# Patient Record
Sex: Female | Born: 1975 | Hispanic: Yes | Marital: Married | Smoking: Former smoker
Health system: Southern US, Community
[De-identification: ages and names within clinical notes are randomized; demographics above are authoritative.]

## PROBLEM LIST (undated history)

## (undated) ENCOUNTER — Inpatient Hospital Stay (HOSPITAL_COMMUNITY): Payer: Self-pay

## (undated) DIAGNOSIS — N6009 Solitary cyst of unspecified breast: Secondary | ICD-10-CM

## (undated) DIAGNOSIS — I00 Rheumatic fever without heart involvement: Secondary | ICD-10-CM

## (undated) DIAGNOSIS — K8050 Calculus of bile duct without cholangitis or cholecystitis without obstruction: Secondary | ICD-10-CM

## (undated) DIAGNOSIS — G43909 Migraine, unspecified, not intractable, without status migrainosus: Secondary | ICD-10-CM

## (undated) HISTORY — PX: WISDOM TOOTH EXTRACTION: SHX21

## (undated) HISTORY — DX: Migraine, unspecified, not intractable, without status migrainosus: G43.909

## (undated) HISTORY — PX: NO PAST SURGERIES: SHX2092

## (undated) HISTORY — DX: Calculus of bile duct without cholangitis or cholecystitis without obstruction: K80.50

---

## 2011-01-22 LAB — HIV ANTIBODY (ROUTINE TESTING W REFLEX): HIV: NONREACTIVE

## 2011-01-22 LAB — ABO/RH: RH Type: POSITIVE

## 2011-01-22 LAB — ANTIBODY SCREEN: Antibody Screen: NEGATIVE

## 2011-07-14 ENCOUNTER — Inpatient Hospital Stay (HOSPITAL_COMMUNITY): Admission: AD | Admit: 2011-07-14 | Payer: Self-pay | Source: Ambulatory Visit | Admitting: Obstetrics and Gynecology

## 2011-07-15 NOTE — L&D Delivery Note (Signed)
Delivery Note At 7:15 AM a viable female, "Samuel Bouche", was delivered via Vaginal, Spontaneous Delivery (Presentation LOA: ;  ).  APGAR: 9, 9; weight 7 lb 8.6 oz (3419 g).   Placenta status: Intact, Spontaneous.  Cord: 3 vessels with the following complications: None.  Cord pH: NA Variable decels during 2nd stage, with good variability throughout.  Patient was complete for 2 hours and 23 minutes, with active pushing beginning at 6:50am.   Had 2 previous very brief episodes of trial pushing prior to that, but then labored down until onset of active pushing as noted, with progress to delivery in 25 minutes.  Anesthesia: Epidural  Episiotomy: None Lacerations: None Suture Repair: None Est. Blood Loss (mL): 150  Mom to postpartum.  Baby to skin to skin with mom. Circumcision planned as inpatient.  [name] 08/22/2011, 7:54 AM

## 2011-07-16 ENCOUNTER — Inpatient Hospital Stay (HOSPITAL_COMMUNITY): Payer: No Typology Code available for payment source

## 2011-07-16 ENCOUNTER — Inpatient Hospital Stay (HOSPITAL_COMMUNITY)
Admission: AD | Admit: 2011-07-16 | Discharge: 2011-07-16 | Disposition: A | Discharge status: Home / Self Care | Payer: No Typology Code available for payment source | Source: Ambulatory Visit | Attending: Obstetrics and Gynecology | Admitting: Obstetrics and Gynecology

## 2011-07-16 ENCOUNTER — Encounter (HOSPITAL_COMMUNITY): Payer: Self-pay

## 2011-07-16 ENCOUNTER — Other Ambulatory Visit: Payer: Self-pay | Admitting: Obstetrics and Gynecology

## 2011-07-16 DIAGNOSIS — O368190 Decreased fetal movements, unspecified trimester, not applicable or unspecified: Secondary | ICD-10-CM | POA: Insufficient documentation

## 2011-07-16 HISTORY — DX: Solitary cyst of unspecified breast: N60.09

## 2011-07-16 HISTORY — DX: Rheumatic fever without heart involvement: I00

## 2011-07-16 NOTE — ED Provider Notes (Signed)
History    36 yo G1P0 at 25 3/7 weeks presented after calling the office with report of decreasing FM since 1/1--had noted some diminishing movement that day, then continued today, with no FM noted early afternoon.  Called the office and was directed here, at which time she did begin to feel some movement.  Denies leaking, bleeding, dysuria, contractions, or any other symptom.  Still aware of less FM than usual.  Pregnancy remarkable for: Migraines Decreased TSH at NOB, normal free T4   Chief Complaint  Patient presents with  . Decreased Fetal Movement     OB History    Grav Para Term Preterm Abortions TAB SAB Ect Mult Living   1               Past Medical History  Diagnosis Date  . Rheumatic fever   . Breast cyst     Past Surgical History  Procedure Date  . No past surgeries     History reviewed. No pertinent family history.  History  Substance Use Topics  . Smoking status: Never Smoker   . Smokeless tobacco: Never Used  . Alcohol Use: Yes     occasional wine    Allergies: No Known Allergies  Prescriptions prior to admission  Medication Sig Dispense Refill  . Prenatal Vit-Fe Fumarate-FA (PRENATAL MULTIVITAMIN) TABS Take 1 tablet by mouth daily.           Physical Exam   Blood pressure 123/66, pulse 117, temperature 98.2 F (36.8 C), temperature source Oral, resp. rate 20, SpO2 97.00%.  Chest clear Heart RRR without murmur Abd gravid, NT Ext WNL  FHR reactive, no decels, one mild variable with single UC UCs very occasional    ED Course  IUP at 35 3/7 weeks Decreased FM  Plan: Will check [place] with BPP for further evaluation of fetal status.  If WNL, will d/c home with [facility] precautions.  [name], CNM, MN 07/16/11 5:25pm  Addendum: Returned from US--[facility] 8/8.  Vtx.  AFI 14.37 cm, 52%ile.  EFW 2572 gm, 5+11 oz, 54%ile.   D/C'd home with [facility] instructions. Follow-up as scheduled at [facility] or prn.  [name], CMN, [place]  07/16/11 6:50p

## 2011-07-16 NOTE — Progress Notes (Signed)
Patient states she has had decreasing fetal movement since 1-1. Has felt some movement on the way to the hospital but not as much as usual. No bleeding or leaking and no pain.

## 2011-08-21 ENCOUNTER — Encounter (HOSPITAL_COMMUNITY): Payer: Self-pay | Admitting: *Deleted

## 2011-08-21 ENCOUNTER — Inpatient Hospital Stay (HOSPITAL_COMMUNITY)
Admission: AD | Admit: 2011-08-21 | Discharge: 2011-08-24 | DRG: 775 | Disposition: A | Discharge status: Home / Self Care | Payer: No Typology Code available for payment source | Source: Ambulatory Visit | Attending: Obstetrics and Gynecology | Admitting: Obstetrics and Gynecology

## 2011-08-21 DIAGNOSIS — O99893 Other specified diseases and conditions complicating puerperium: Secondary | ICD-10-CM | POA: Diagnosis not present

## 2011-08-21 DIAGNOSIS — R011 Cardiac murmur, unspecified: Secondary | ICD-10-CM | POA: Diagnosis not present

## 2011-08-21 DIAGNOSIS — Z3400 Encounter for supervision of normal first pregnancy, unspecified trimester: Secondary | ICD-10-CM

## 2011-08-21 DIAGNOSIS — O4700 False labor before 37 completed weeks of gestation, unspecified trimester: Secondary | ICD-10-CM

## 2011-08-21 DIAGNOSIS — IMO0001 Reserved for inherently not codable concepts without codable children: Secondary | ICD-10-CM

## 2011-08-21 LAB — CBC
HCT: 33.5 % — ABNORMAL LOW (ref 36.0–46.0)
Hemoglobin: 11.3 g/dL — ABNORMAL LOW (ref 12.0–15.0)
MCHC: 33.7 g/dL (ref 30.0–36.0)
MCV: 88.6 fL (ref 78.0–100.0)
WBC: 7.4 10*3/uL (ref 4.0–10.5)

## 2011-08-21 MED ORDER — ONDANSETRON HCL 4 MG/2ML IJ SOLN: 4.0000 mg | Freq: Four times a day (QID) | INTRAMUSCULAR | Status: DC | PRN

## 2011-08-21 MED ORDER — LACTATED RINGERS IV SOLN: 500.0000 mL | INTRAVENOUS | Status: DC | PRN

## 2011-08-21 MED ORDER — IBUPROFEN 600 MG PO TABS: 600.0000 mg | ORAL_TABLET | Freq: Four times a day (QID) | ORAL | Status: DC | PRN

## 2011-08-21 MED ORDER — BUTORPHANOL TARTRATE 2 MG/ML IJ SOLN: 1.0000 mg | INTRAMUSCULAR | Status: DC | PRN

## 2011-08-21 MED ORDER — OXYTOCIN 20 UNITS IN LACTATED RINGERS INFUSION - SIMPLE: 125.0000 mL/h | Freq: Once | INTRAVENOUS | Status: DC

## 2011-08-21 MED ORDER — OXYTOCIN BOLUS FROM INFUSION
500.0000 mL | Freq: Once | INTRAVENOUS | Status: DC
  Filled 2011-08-21: qty 500

## 2011-08-21 MED ORDER — LIDOCAINE HCL (PF) 1 % IJ SOLN: 30.0000 mL | INTRAMUSCULAR | Status: DC | PRN

## 2011-08-21 MED ORDER — ACETAMINOPHEN 325 MG PO TABS: 650.0000 mg | ORAL_TABLET | ORAL | Status: DC | PRN

## 2011-08-21 MED ORDER — FLEET ENEMA 7-19 GM/118ML RE ENEM: 1.0000 | ENEMA | RECTAL | Status: DC | PRN

## 2011-08-21 MED ORDER — LACTATED RINGERS IV SOLN
INTRAVENOUS | Status: DC
  Administered 2011-08-21 – 2011-08-22 (×2): via INTRAVENOUS

## 2011-08-21 MED ORDER — CITRIC ACID-SODIUM CITRATE 334-500 MG/5ML PO SOLN: 30.0000 mL | ORAL | Status: DC | PRN

## 2011-08-21 MED ORDER — OXYCODONE-ACETAMINOPHEN 5-325 MG PO TABS: 1.0000 | ORAL_TABLET | ORAL | Status: DC | PRN

## 2011-08-21 NOTE — Progress Notes (Signed)
Contractions q 3-5 minutes x 2 hours. Denies bleeding or ROM

## 2011-08-21 NOTE — H&P (Signed)
Sara Ross is a 36 y.o. female, G1P0 at 62 4/7 weeks, presenting for contractions q 3-5 min x 2 hours.  Reports contractions began about 3 am today, with gradual increase in frequency and intensity since then.  Had positive FM, denies leaking or bleeding.  Pregnancy remarkable for: Hx migraines Hx decreased TSH at NOB, with normal f/u labs GBS negative  History of present pregnancy: Patient entered care at 7 weeks for interview, 10 weeks for NOB w/u.  She had TSH done at NOB due to increased N/V, with TSH of 0.061, but normal free T 4 in follow-up.  EDC of 08/18/11 was established by LMP and in agreement with 1st trimester screen.  Anatomy scan was done at 18 weeks, with normal findings, but limited anatomy. Anatomy was completed at a follow-up visit.  Her prenatal course was essentially uncomplicated.    At her last evalution, she was 1 cm--this was in office today.  History OB History    Grav Para Term Preterm Abortions TAB SAB Ect Mult Living   1              Past Medical History  Diagnosis Date  . Rheumatic fever   . Breast cyst   Previous Depo user, d/c'd 03/2010.. Migraines on birth control.    Past Surgical History  Procedure Date  . No past surgeries   Biopsy right breast 2010, WNL  Family History: family history is not on file.  Mother HTN.  Sister thyroid disease.  Mother ? Liver/kidney cancer.  Father throat cancer.  Social History:  reports that she has never smoked. She has never used smokeless tobacco. She reports that she drinks alcohol. She reports that she does not use illicit drugs. 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  Patient is college educated and a homemaker.  ROS:  Contractions q 3-5 min x several hours, denies PIH symptoms. Reports + FM.  Dilation: 3 Effacement (%): 100 Station: -1 Exam by:: V.Autym Siess,CNM Blood pressure 126/72, pulse 85, resp. rate 18, height 5\' 2"   (1.575 m), weight 68.493 kg (151 lb), SpO2 98.00%.  Exam Physical Exam   Chest clear Heart RRR without murmur Abd gravid, NT Pelvic--see exam.  Tight band of tissue around cervical os. Ext WNL, no edema  FHR reactive, no decels UC q 3-4 min, moderate    Prenatal labs: ABO, Rh:  AB+ Antibody:  Neg Rubella:  Immune RPR:   NR HBsAg:   Neg HIV:  Neg  GBS:   Neg TSH 0.061 at NOB, normal free t 4 1st trimester screen and AFP WNL Hgb 11.4 at NOB/10.3 at 28 weeks Glucola WNL.   Assessment/Plan: IUP at 40 3/7 weeks Early labor GBS negative  Plan: Admitted to [facility] per consult with Dr. Stefano Gaul Routine CNM orders Plans epidural.  Darlean Warmoth 08/21/2011, 11:11 PM

## 2011-08-22 ENCOUNTER — Inpatient Hospital Stay (HOSPITAL_COMMUNITY): Payer: No Typology Code available for payment source | Admitting: Anesthesiology

## 2011-08-22 ENCOUNTER — Encounter (HOSPITAL_COMMUNITY): Payer: Self-pay | Admitting: Anesthesiology

## 2011-08-22 ENCOUNTER — Encounter (HOSPITAL_COMMUNITY): Payer: Self-pay

## 2011-08-22 DIAGNOSIS — IMO0001 Reserved for inherently not codable concepts without codable children: Secondary | ICD-10-CM

## 2011-08-22 DIAGNOSIS — Z3400 Encounter for supervision of normal first pregnancy, unspecified trimester: Secondary | ICD-10-CM

## 2011-08-22 LAB — GC/CHLAMYDIA PROBE AMP, GENITAL

## 2011-08-22 MED ORDER — MEASLES, MUMPS & RUBELLA VAC ~~LOC~~ INJ
0.5000 mL | INJECTION | Freq: Once | SUBCUTANEOUS | Status: AC
  Administered 2011-08-23: 0.5 mL via SUBCUTANEOUS
  Filled 2011-08-22 (×2): qty 0.5

## 2011-08-22 MED ORDER — PHENYLEPHRINE 40 MCG/ML (10ML) SYRINGE FOR IV PUSH (FOR BLOOD PRESSURE SUPPORT)
80.0000 ug | PREFILLED_SYRINGE | INTRAVENOUS | Status: DC | PRN
  Filled 2011-08-22: qty 5

## 2011-08-22 MED ORDER — OXYTOCIN 20 UNITS IN LACTATED RINGERS INFUSION - SIMPLE
1.0000 m[IU]/min | INTRAVENOUS | Status: DC
  Administered 2011-08-22: 1 m[IU]/min via INTRAVENOUS
  Filled 2011-08-22: qty 1000

## 2011-08-22 MED ORDER — DIPHENHYDRAMINE HCL 25 MG PO CAPS: 25.0000 mg | ORAL_CAPSULE | Freq: Four times a day (QID) | ORAL | Status: DC | PRN

## 2011-08-22 MED ORDER — DIPHENHYDRAMINE HCL 50 MG/ML IJ SOLN: 12.5000 mg | INTRAMUSCULAR | Status: DC | PRN

## 2011-08-22 MED ORDER — BENZOCAINE-MENTHOL 20-0.5 % EX AERO
INHALATION_SPRAY | CUTANEOUS | Status: AC
  Filled 2011-08-22: qty 56

## 2011-08-22 MED ORDER — ZOLPIDEM TARTRATE 10 MG PO TABS: 10.0000 mg | ORAL_TABLET | Freq: Every evening | ORAL | Status: DC | PRN

## 2011-08-22 MED ORDER — FENTANYL 2.5 MCG/ML BUPIVACAINE 1/10 % EPIDURAL INFUSION (WH - ANES)
INTRAMUSCULAR | Status: DC | PRN
  Administered 2011-08-22: 14 mL/h via EPIDURAL

## 2011-08-22 MED ORDER — SENNOSIDES-DOCUSATE SODIUM 8.6-50 MG PO TABS
2.0000 | ORAL_TABLET | Freq: Every day | ORAL | Status: DC
  Administered 2011-08-22: 2 via ORAL

## 2011-08-22 MED ORDER — SIMETHICONE 80 MG PO CHEW: 80.0000 mg | CHEWABLE_TABLET | ORAL | Status: DC | PRN

## 2011-08-22 MED ORDER — OXYCODONE-ACETAMINOPHEN 5-325 MG PO TABS
1.0000 | ORAL_TABLET | ORAL | Status: DC | PRN
  Administered 2011-08-22: 1 via ORAL
  Filled 2011-08-22: qty 1

## 2011-08-22 MED ORDER — TETANUS-DIPHTH-ACELL PERTUSSIS 5-2.5-18.5 LF-MCG/0.5 IM SUSP
0.5000 mL | Freq: Once | INTRAMUSCULAR | Status: AC
  Administered 2011-08-23: 0.5 mL via INTRAMUSCULAR
  Filled 2011-08-22: qty 0.5

## 2011-08-22 MED ORDER — ONDANSETRON HCL 4 MG PO TABS: 4.0000 mg | ORAL_TABLET | ORAL | Status: DC | PRN

## 2011-08-22 MED ORDER — DIBUCAINE 1 % RE OINT
1.0000 "application " | TOPICAL_OINTMENT | RECTAL | Status: DC | PRN
  Filled 2011-08-22: qty 28

## 2011-08-22 MED ORDER — BENZOCAINE-MENTHOL 20-0.5 % EX AERO: 1.0000 "application " | INHALATION_SPRAY | CUTANEOUS | Status: DC | PRN

## 2011-08-22 MED ORDER — MAGNESIUM HYDROXIDE 400 MG/5ML PO SUSP: 30.0000 mL | ORAL | Status: DC | PRN

## 2011-08-22 MED ORDER — TERBUTALINE SULFATE 1 MG/ML IJ SOLN: 0.2500 mg | Freq: Once | INTRAMUSCULAR | Status: DC | PRN

## 2011-08-22 MED ORDER — EPHEDRINE 5 MG/ML INJ: 10.0000 mg | INTRAVENOUS | Status: DC | PRN

## 2011-08-22 MED ORDER — SODIUM BICARBONATE 8.4 % IV SOLN
INTRAVENOUS | Status: DC | PRN
  Administered 2011-08-22: 4 mL via EPIDURAL

## 2011-08-22 MED ORDER — PRENATAL MULTIVITAMIN CH
1.0000 | ORAL_TABLET | Freq: Every day | ORAL | Status: DC
  Administered 2011-08-22 – 2011-08-24 (×3): 1 via ORAL
  Filled 2011-08-22 (×3): qty 1

## 2011-08-22 MED ORDER — ZOLPIDEM TARTRATE 5 MG PO TABS: 5.0000 mg | ORAL_TABLET | Freq: Every evening | ORAL | Status: DC | PRN

## 2011-08-22 MED ORDER — EPHEDRINE 5 MG/ML INJ
10.0000 mg | INTRAVENOUS | Status: DC | PRN
  Filled 2011-08-22: qty 4

## 2011-08-22 MED ORDER — ONDANSETRON HCL 4 MG/2ML IJ SOLN: 4.0000 mg | INTRAMUSCULAR | Status: DC | PRN

## 2011-08-22 MED ORDER — LANOLIN HYDROUS EX OINT: TOPICAL_OINTMENT | CUTANEOUS | Status: DC | PRN

## 2011-08-22 MED ORDER — FENTANYL 2.5 MCG/ML BUPIVACAINE 1/10 % EPIDURAL INFUSION (WH - ANES)
14.0000 mL/h | INTRAMUSCULAR | Status: DC
  Administered 2011-08-22: 14 mL/h via EPIDURAL
  Filled 2011-08-22 (×2): qty 60

## 2011-08-22 MED ORDER — IBUPROFEN 600 MG PO TABS
600.0000 mg | ORAL_TABLET | Freq: Four times a day (QID) | ORAL | Status: DC
  Administered 2011-08-22 – 2011-08-24 (×6): 600 mg via ORAL
  Filled 2011-08-22 (×8): qty 1

## 2011-08-22 MED ORDER — LACTATED RINGERS IV SOLN: 500.0000 mL | Freq: Once | INTRAVENOUS | Status: DC

## 2011-08-22 MED ORDER — WITCH HAZEL-GLYCERIN EX PADS: 1.0000 "application " | MEDICATED_PAD | CUTANEOUS | Status: DC | PRN

## 2011-08-22 MED ORDER — PHENYLEPHRINE 40 MCG/ML (10ML) SYRINGE FOR IV PUSH (FOR BLOOD PRESSURE SUPPORT): 80.0000 ug | PREFILLED_SYRINGE | INTRAVENOUS | Status: DC | PRN

## 2011-08-22 NOTE — Anesthesia Procedure Notes (Signed)

## 2011-08-22 NOTE — Progress Notes (Addendum)
  Subjective: Comfortable with epidural  Objective: BP 92/44  Pulse 105  Temp(Src) 97.9 F (36.6 C) (Oral)  Resp 18  Ht 5\' 2"  (1.575 m)  Wt 68.493 kg (151 lb)  BMI 27.62 kg/m2  SpO2 98%      FHT:  Category 1 UC:   irregular, every 3-4 minutes SVE:   Dilation: 7 Effacement (%): 100 Station: 0 Exam by:: Manfred Arch, CNM AROM--clear fluid IUPC placed  Labs: Lab Results  Component Value Date   WBC 7.4 08/21/2011   HGB 11.3* 08/21/2011   HCT 33.5* 08/21/2011   MCV 88.6 08/21/2011   PLT 226 08/21/2011    Assessment / Plan: Spontaneous labor, progressing normally Will continue to observe--augment prn.   Sara Ross 08/22/2011, 2am

## 2011-08-22 NOTE — Progress Notes (Signed)
  Subjective: Slight increase in pressure.  Objective: BP 109/72  Pulse 118  Temp(Src) 98.1 F (36.7 C) (Oral)  Resp 18  Ht 5\' 2"  (1.575 m)  Wt 68.493 kg (151 lb)  BMI 27.62 kg/m2  SpO2 98%     FHT:  140s, moderate variability, mild variable decels  UC:   q 2-4 minutes, irregular in quality.  SVE:   Dilation: 10 Effacement (%): 100 Station: 0;+1 Exam by:: Manfred Arch, CNM Pitocin on 3 mu/min   Assessment / Plan: Attempted push--good thrust of contraction, patient not yet very coordinated with pushing effort. Will defer pushing at present until sensation of pressure increases. Will increase pitocin to 4 mu/min and observe.    [name] 08/22/2011, 5:28 AM

## 2011-08-22 NOTE — Progress Notes (Signed)
  Subjective: Comfortable with epidural  Objective: BP 92/44  Pulse 105  Temp(Src) 97.9 F (36.6 C) (Oral)  Resp 18  Ht 5\' 2"  (1.575 m)  Wt 68.493 kg (151 lb)  BMI 27.62 kg/m2  SpO2 98%      FHT:  Category 1 UC:   irregular, every 4-5 minutes, irregular in quality SVE:   Dilation: 7 Effacement (%): 100 Station: 0 Exam by:: Manfred Arch, CNM   Assessment / Plan: Inadequate labor s/p AROM Will start pitocin augmentation.    Sara Ross 08/22/2011, 3:39 AM

## 2011-08-22 NOTE — Progress Notes (Signed)
  Subjective: Feeling some pressure with contractions.  Objective: BP 100/62  Pulse 102  Temp(Src) 98.1 F (36.7 C) (Oral)  Resp 16  Ht 5\' 2"  (1.575 m)  Wt 68.493 kg (151 lb)  BMI 27.62 kg/m2  SpO2 98%      FHT:  FHR: 140s bpm, variability: moderate,  accelerations:  Present,  decelerations:  Present mild variable decels with contractions. UC:   irregular, every 2-5 minutes, most q 2-3 min. MVUs 150 Pitocin on 3 mu/min SVE:   Dilation: 10 Effacement (%): 100 Station: 0;+1 Exam by:: Manfred Arch, CNM   Assessment / Plan: Second stage labor, laboring down. Will continue to observe FHR tracing--if variables persist, will do amnioinfusion. Await increased urge to push.  [name] 08/22/2011, 4:57 AM

## 2011-08-22 NOTE — Anesthesia Preprocedure Evaluation (Signed)
Anesthesia Evaluation  Patient identified by MRN, date of birth, ID band Patient awake    Reviewed: Allergy & Precautions, H&P , Patient's Chart, lab work & pertinent test results  Airway Mallampati: II TM Distance: >3 FB Neck ROM: full    Dental  (+) Teeth Intact   Pulmonary  clear to auscultation        Cardiovascular Valvular problems/murmurs: neg w/u for Rheumatic Heart Dz. regular Normal    Neuro/Psych    GI/Hepatic   Endo/Other    Renal/GU      Musculoskeletal   Abdominal   Peds  Hematology   Anesthesia Other Findings       Reproductive/Obstetrics (+) Pregnancy                           Anesthesia Physical Anesthesia Plan  ASA: II  Anesthesia Plan: Epidural   Post-op Pain Management:    Induction:   Airway Management Planned:   Additional Equipment:   Intra-op Plan:   Post-operative Plan:   Informed Consent: I have reviewed the patients History and Physical, chart, labs and discussed the procedure including the risks, benefits and alternatives for the proposed anesthesia with the patient or authorized representative who has indicated his/her understanding and acceptance.   Dental Advisory Given  Plan Discussed with:   Anesthesia Plan Comments: (Labs checked- platelets confirmed with RN in room. Fetal heart tracing, per RN, reported to be stable enough for sitting procedure. Discussed epidural, and patient consents to the procedure:  included risk of possible headache,backache, failed block, allergic reaction, and nerve injury. This patient was asked if she had any questions or concerns before the procedure started. )        Anesthesia Quick Evaluation

## 2011-08-23 LAB — CBC
MCH: 30.5 pg (ref 26.0–34.0)
MCHC: 33.8 g/dL (ref 30.0–36.0)
MCV: 90.2 fL (ref 78.0–100.0)
Platelets: 168 10*3/uL (ref 150–400)
RDW: 14 % (ref 11.5–15.5)

## 2011-08-23 NOTE — Anesthesia Postprocedure Evaluation (Signed)
  Anesthesia Post-op Note  Patient: Pharmacist, community  Procedure(s) Performed: * No procedures listed *  Patient Location: Mother/Baby  Anesthesia Type: Epidural  Level of Consciousness: awake, alert  and oriented  Airway and Oxygen Therapy: Patient Spontanous Breathing  Post-op Pain: mild  Post-op Assessment: Patient's Cardiovascular Status Stable, Respiratory Function Stable, Patent Airway, No signs of Nausea or vomiting and Pain level controlled  Post-op Vital Signs: stable  Complications: No apparent anesthesia complications

## 2011-08-23 NOTE — Anesthesia Postprocedure Evaluation (Signed)
  Anesthesia Post-op Note  Patient: Sara Ross  Procedure(s) Performed: * No procedures listed *  Patient Location: Mother/Baby  Anesthesia Type: Epidural  Level of Consciousness: awake, alert  and oriented  Airway and Oxygen Therapy: Patient Spontanous Breathing  Post-op Pain: mild  Post-op Assessment: Patient's Cardiovascular Status Stable, Respiratory Function Stable, Patent Airway, No signs of Nausea or vomiting and Pain level controlled  Post-op Vital Signs: stable  Complications: No apparent anesthesia complications 

## 2011-08-23 NOTE — Progress Notes (Signed)
Post Partum Day 1 Subjective: Reports feeling well.  Ambulating, voiding and tol po liquids and solids without difficulty.  Denies weakness or dizziness.  Some nipple tenderness with breast feeding, otherwise OK.  Plans circ prior to d/c for baby. States she had rheumatic fever in the past and has undergone echocardiography in the past few years after that diagnosis and no abnormalities identified.  Denies any previous hx of murmur.    Objective: Blood pressure 89/65, pulse 94, temperature 98.3 F (36.8 C), temperature source Oral, resp. rate 18, height 5\' 2"  (1.575 m), weight 68.493 kg (151 lb), SpO2 97.00%, unknown if currently breastfeeding.  Physical Exam:  General: alert, cooperative and no distress Heart:  RRR, Gr III/VI murmur noted rt 4th ICS and at apex. Lungs:  CTA bilat Abd:  Soft, NT with pos BS x 4 quads Lochia: appropriate Uterine Fundus: firm, NT, 1 below umb. Incision: N/A  Perineum intact DVT Evaluation: No evidence of DVT seen on physical exam. Negative Homan's sign bilat. No significant calf/ankle edema.   Basename 08/23/11 0530 08/21/11 2320  HGB 10.0* 11.3*  HCT 29.6* 33.5*    Assessment/Plan: Stable s/p vaginal delivery Hx rheumatic fever Cardiac murmur  Continue current care and observe carefully. Anticipate discharge to home tomorrow.  Dr. Normand Sloop aware of PE findings.    LOS: 2 days   Sara Ross O. 08/23/2011, 10:43 AM

## 2011-08-24 MED ORDER — IBUPROFEN 600 MG PO TABS: 600.0000 mg | ORAL_TABLET | Freq: Four times a day (QID) | ORAL | Status: AC

## 2011-08-24 NOTE — Progress Notes (Signed)
Post Partum Day 2 Subjective: no complaints, up ad lib without syncope, voiding, tolerating PO, + flatus  Pain well controlled with po meds BF well Mood stable, bonding well   Objective: Blood pressure 102/68, pulse 99, temperature 98.7 F (37.1 C), temperature source Oral, resp. rate 18, height 5\' 2"  (1.575 m), weight 68.493 kg (151 lb), SpO2 97.00%, unknown if currently breastfeeding.  Physical Exam:  General: alert and no distress Lungs: CTAB Heart: RRR Breasts: WNL Lochia: appropriate Uterine Fundus: firm Perineum: WNL DVT Evaluation: No evidence of DVT seen on physical exam. Negative Homan's sign. No significant calf/ankle edema.   Basename 08/23/11 0530 08/21/11 2320  HGB 10.0* 11.3*  HCT 29.6* 33.5*    Assessment/Plan: Discharge home and Contraception husband plans vasectomy      LOS: 3 days   Labron Bloodgood M 08/24/2011, 10:33 AM

## 2011-08-24 NOTE — Discharge Summary (Signed)
   Obstetric Discharge Summary Reason for Admission: onset of labor Prenatal Procedures: ultrasound Intrapartum Procedures: spontaneous vaginal delivery and epidural Postpartum Procedures: none Complications-Operative and Postpartum: none  Temp:  [98.6 F (37 C)-98.7 F (37.1 C)] 98.7 F (37.1 C) (02/10 0541) Pulse Rate:  [92-99] 99  (02/10 0541) Resp:  [18] 18  (02/10 0541) BP: (102-111)/(68-75) 102/68 mmHg (02/10 0541) Hemoglobin  Date Value Range Status  08/23/2011 10.0* 12.0-15.0 (g/dL) Final     HCT  Date Value Range Status  08/23/2011 29.6* 36.0-46.0 (%) Final    Hospital Course:  Hospital Course: Admitted in labor and received epidural. neg GBS. AROM, and IUPC placed, pitocin aug, laboring down,  Progressed to fully dilated, . Delivery was performed by V.Emilee Hero, CNM without difficulty. Patient and baby tolerated the procedure without difficulty, with no laceration noted. Infant to FTN. Mother and infant then had an uncomplicated postpartum course, with breast feeding going well. Mom's physical exam was WNL, and she was discharged home in stable condition. Contraception plan was vasectomy for husband.  She received adequate benefit from po pain medications.  Discharge Diagnoses: Term Pregnancy-delivered  Discharge Information: Date: 08/24/2011 Activity: pelvic rest Diet: routine Medications:  Medication List  As of 08/24/2011  3:45 PM   START taking these medications         ibuprofen 600 MG tablet   Commonly known as: ADVIL,MOTRIN   Take 1 tablet (600 mg total) by mouth every 6 (six) hours.         CONTINUE taking these medications         calcium carbonate 500 MG chewable tablet   Commonly known as: TUMS - dosed in mg elemental calcium      prenatal multivitamin Tabs          Where to get your medications    These are the prescriptions that you need to pick up.   You may get these medications from any pharmacy.         ibuprofen 600 MG tablet            Condition: stable Instructions: refer to practice specific booklet Discharge to: home Follow-up Information    Follow up with Aviel Davalos M, CNM in 6 weeks. (If symptoms worsen)    Contact information:   [address] [place] [address] [phone]          Newborn Data: Live born  Information for the patient's newborn:  Kingslee, Dowse [[id number]]  female ; APGAR , 9, 9 ; weight ; 7#9oz Home with mother.  Shaneta Cervenka M 08/24/2011, 3:45 PM

## 2011-10-08 ENCOUNTER — Ambulatory Visit (INDEPENDENT_AMBULATORY_CARE_PROVIDER_SITE_OTHER): Payer: No Typology Code available for payment source | Admitting: Obstetrics and Gynecology

## 2011-12-22 ENCOUNTER — Emergency Department (HOSPITAL_BASED_OUTPATIENT_CLINIC_OR_DEPARTMENT_OTHER)
Admission: EM | Admit: 2011-12-22 | Discharge: 2011-12-22 | Disposition: A | Discharge status: Home / Self Care | Payer: No Typology Code available for payment source | Attending: Emergency Medicine | Admitting: Emergency Medicine

## 2011-12-22 ENCOUNTER — Emergency Department (HOSPITAL_BASED_OUTPATIENT_CLINIC_OR_DEPARTMENT_OTHER): Payer: No Typology Code available for payment source

## 2011-12-22 ENCOUNTER — Encounter (HOSPITAL_BASED_OUTPATIENT_CLINIC_OR_DEPARTMENT_OTHER): Payer: Self-pay

## 2011-12-22 DIAGNOSIS — R1011 Right upper quadrant pain: Secondary | ICD-10-CM | POA: Insufficient documentation

## 2011-12-22 DIAGNOSIS — R1013 Epigastric pain: Secondary | ICD-10-CM | POA: Insufficient documentation

## 2011-12-22 DIAGNOSIS — K8020 Calculus of gallbladder without cholecystitis without obstruction: Secondary | ICD-10-CM | POA: Insufficient documentation

## 2011-12-22 LAB — CBC
HCT: 36.5 % (ref 36.0–46.0)
Hemoglobin: 12.8 g/dL (ref 12.0–15.0)
MCH: 30.7 pg (ref 26.0–34.0)
MCHC: 35.1 g/dL (ref 30.0–36.0)
MCV: 87.5 fL (ref 78.0–100.0)
Platelets: 252 10*3/uL (ref 150–400)
RBC: 4.17 MIL/uL (ref 3.87–5.11)
RDW: 13.6 % (ref 11.5–15.5)
WBC: 8.7 K/uL (ref 4.0–10.5)

## 2011-12-22 LAB — DIFFERENTIAL
Basophils Absolute: 0 K/uL (ref 0.0–0.1)
Basophils Relative: 1 % (ref 0–1)
Eosinophils Absolute: 0.1 10*3/uL (ref 0.0–0.7)
Eosinophils Relative: 2 % (ref 0–5)
Lymphocytes Relative: 26 % (ref 12–46)
Lymphs Abs: 2.3 10*3/uL (ref 0.7–4.0)
Monocytes Absolute: 0.5 K/uL (ref 0.1–1.0)
Monocytes Relative: 5 % (ref 3–12)
Neutro Abs: 5.8 K/uL (ref 1.7–7.7)
Neutrophils Relative %: 66 % (ref 43–77)

## 2011-12-22 LAB — COMPREHENSIVE METABOLIC PANEL WITH GFR
ALT: 14 U/L (ref 0–35)
AST: 21 U/L (ref 0–37)
Albumin: 4.4 g/dL (ref 3.5–5.2)
Alkaline Phosphatase: 77 U/L (ref 39–117)
CO2: 27 meq/L (ref 19–32)
Chloride: 102 meq/L (ref 96–112)
GFR calc non Af Amer: >90 mL/min (ref >90)
Potassium: 3.5 meq/L (ref 3.5–5.1)
Total Bilirubin: 0.5 mg/dL (ref 0.3–1.2)

## 2011-12-22 LAB — URINALYSIS, ROUTINE W REFLEX MICROSCOPIC
Bilirubin Urine: NEGATIVE
Glucose, UA: NEGATIVE mg/dL
Hgb urine dipstick: NEGATIVE
Ketones, ur: NEGATIVE mg/dL
Nitrite: NEGATIVE
Protein, ur: NEGATIVE mg/dL
Specific Gravity, Urine: 1.02 (ref 1.005–1.030)
Urobilinogen, UA: 0.2 mg/dL (ref 0.0–1.0)
pH: 7.5 (ref 5.0–8.0)

## 2011-12-22 LAB — COMPREHENSIVE METABOLIC PANEL
BUN: 15 mg/dL (ref 6–23)
Calcium: 9.7 mg/dL (ref 8.4–10.5)
Creatinine, Ser: 0.6 mg/dL (ref 0.50–1.10)
GFR calc Af Amer: >90 mL/min (ref >90)
Glucose, Bld: 90 mg/dL (ref 70–99)
Sodium: 141 mEq/L (ref 135–145)
Total Protein: 8.1 g/dL (ref 6.0–8.3)

## 2011-12-22 LAB — URINE MICROSCOPIC-ADD ON

## 2011-12-22 LAB — PREGNANCY, URINE: Preg Test, Ur: NEGATIVE

## 2011-12-22 MED ORDER — MORPHINE SULFATE 4 MG/ML IJ SOLN
4.0000 mg | Freq: Once | INTRAMUSCULAR | Status: AC
  Administered 2011-12-22: 4 mg via INTRAVENOUS
  Filled 2011-12-22: qty 1

## 2011-12-22 MED ORDER — ONDANSETRON 8 MG PO TBDP: 8.0000 mg | ORAL_TABLET | Freq: Three times a day (TID) | ORAL | Status: AC | PRN

## 2011-12-22 MED ORDER — ONDANSETRON HCL 4 MG/2ML IJ SOLN
4.0000 mg | Freq: Once | INTRAMUSCULAR | Status: AC
  Administered 2011-12-22: 4 mg via INTRAVENOUS
  Filled 2011-12-22: qty 2

## 2011-12-22 MED ORDER — OXYCODONE-ACETAMINOPHEN 5-325 MG PO TABS: 1.0000 | ORAL_TABLET | Freq: Four times a day (QID) | ORAL | Status: AC | PRN

## 2011-12-22 MED ORDER — HYDROMORPHONE HCL PF 1 MG/ML IJ SOLN
1.0000 mg | Freq: Once | INTRAMUSCULAR | Status: AC
  Administered 2011-12-22: 1 mg via INTRAVENOUS
  Filled 2011-12-22: qty 1

## 2011-12-22 NOTE — ED Notes (Signed)
Pt reports abdominal pain that started today, nausea and vomiting last night.

## 2011-12-22 NOTE — ED Notes (Signed)
Pt educated about not breastfeeding if taking pain medication, educated to take tylenol or ibuprofen unless pain not controlled then pt to take vicodin, pt educated not to breastfeed if  Consistently taking pain med

## 2011-12-22 NOTE — Discharge Instructions (Signed)
Please read and follow all provided instructions.  Your diagnoses today include:  1. Cholelithiasis     Tests performed today include:  Blood counts and electrolytes  Blood tests to check liver and kidney function  Blood tests to check pancreas function  Urine test to look for infection and pregnancy (in women)  Ultrasound which shows gallstones but no gallbladder infection  Vital signs. See below for your results today.   Medications prescribed:   Percocet (oxycodone/acetaminophen) - narcotic pain medication  You have been prescribed narcotic pain medication such as Vicodin or Percocet: DO NOT drive or perform any activities that require you to be awake and alert because this medicine can make you drowsy. BE VERY CAREFUL not to take multiple medicines containing Tylenol (also called acetaminophen). Doing so can lead to an overdose which can damage your liver and cause liver failure and possibly death.    Zofran (ondansetron) - for nausea and vomiting  Take any prescribed medications only as directed.  Home care instructions:   Follow any educational materials contained in this packet.  Follow-up instructions: Please follow-up with the surgery referral in the next 1 week for further evaluation of your symptoms.  If you do not have a primary care doctor -- see below for referral information.   Return instructions:  SEEK IMMEDIATE MEDICAL ATTENTION IF:  The pain does not go away or becomes severe   A temperature above 101F develops   Repeated vomiting occurs (multiple episodes)   The pain becomes localized to portions of the abdomen. The right side could possibly be appendicitis. In an adult, the left lower portion of the abdomen could be colitis or diverticulitis.   Blood is being passed in stools or vomit (bright red or black tarry stools)   You develop chest pain, difficulty breathing, dizziness or fainting, or become confused, poorly responsive, or inconsolable  (young children)  If you have any other emergent concerns regarding your health  Additional Information: Abdominal (belly) pain can be caused by many things. Your caregiver performed an examination and possibly ordered blood/urine tests and imaging (CT scan, x-rays, ultrasound). Many cases can be observed and treated at home after initial evaluation in the emergency department. Even though you are being discharged home, abdominal pain can be unpredictable. Therefore, you need a repeated exam if your pain does not resolve, returns, or worsens. Most patients with abdominal pain don't have to be admitted to the hospital or have surgery, but serious problems like appendicitis and gallbladder attacks can start out as nonspecific pain. Many abdominal conditions cannot be diagnosed in one visit, so follow-up evaluations are very important.  Your vital signs today were: BP 108/67  Pulse 72  Temp 97.6 F (36.4 C)  Resp 16  Ht 5\' 2"  (1.575 m)  Wt 120 lb (54.432 kg)  BMI 21.95 kg/m2  SpO2 100%  Breastfeeding? Yes If your blood pressure (bp) was elevated above 135/85 this visit, please have this repeated by your doctor within one month. -------------- No Primary Care Doctor Call [facility]  [phone] Other agencies that provide inexpensive medical care    [facility]  [phone]    [facility]  [phone]    [facility]  [phone]    [facility]  [phone]    [facility]  [phone]    [facility]  [phone] -------------- RESOURCE GUIDE:  Dental Problems  Patients with Medicaid: [facility]  [facility] [address]                                            [phone] [address] [employer] Phone:  [phone]                                                      Phone:  [phone]  If unable to pay or uninsured, contact:  [facility] or [facility]. to become qualified for the adult  dental clinic.  Chronic Pain Problems Contact [facility]  [phone] Patients need to be referred by their primary care doctor.  Insufficient Money for Medicine Contact [employer]:  call "211" or [facility] [phone].  Psychological Services [facility]  [phone] [facility]  [phone] [facility]   [phone] (emergency services [phone])  Substance Abuse Resources [facility]  [phone] [facility] [phone] [facility] [place] [phone] Floydene Flock [phone] Residential & Outpatient Substance Abuse Program  [phone]  Abuse/Neglect [facility] Child Abuse Hotline 807-017-2211 [facility] Child Abuse Hotline (940)040-1027 (After Hours)  Emergency Shelter [facility] [phone] [phone]  Maternity Homes [facility] [place] [facility] [phone] [facility] ([phone]  [facility] Resources  [facility]     [employer]                          [facility] Dept. [address] [place]                       [address]      [address]  Blondell Reveal Phone:  [phone]                                   Phone:  [phone]                 Phone:  [phone]  [facility] Mental Health Phone:  [phone]  [facility] Child Abuse Hotline 901-627-2869 [phone] (After Hours)

## 2011-12-22 NOTE — ED Provider Notes (Signed)
History     CSN: [id number]  Arrival date & time 12/22/11  7829   First MD Initiated Contact with Patient 12/22/11 1928      Chief Complaint  Patient presents with  . Abdominal Pain    (Consider location/radiation/quality/duration/timing/severity/associated sxs/prior treatment) HPI Comments: Patient presents with complaint of abdominal pain in the epigastrium and right upper quadrant that started this afternoon. Pain is sharp and described as a pulling sensation and does not radiate. She has not had associated nausea or vomiting today however she did have nausea, vomiting in the absence of pain last night. Patient has had 2 episodes of similar symptoms in the past several months. She has no history of gallbladder problems. Food did not seem to make the pain worse. She denies blood in stool or bowel changes. She denies urinary symptoms. Palpation makes the pain worse. Nothing makes it better. No treatments prior to arrival. Onset was acute, course is constant.  Patient is a 36 y.o. female presenting with abdominal pain. The history is provided by the patient.  Abdominal Pain The primary symptoms of the illness include abdominal pain. The primary symptoms of the illness do not include fever, shortness of breath, nausea, vomiting, diarrhea, hematochezia or dysuria. The current episode started 3 to 5 hours ago. The onset of the illness was sudden. The problem has not changed since onset.   Past Medical History  Diagnosis Date  . Rheumatic fever   . Breast cyst     Past Surgical History  Procedure Date  . No past surgeries     No family history on file.  History  Substance Use Topics  . Smoking status: Never Smoker   . Smokeless tobacco: Never Used  . Alcohol Use: 1.2 oz/week    2 Glasses of wine per week     occasional wine    OB History    Grav Para Term Preterm Abortions TAB SAB Ect Mult Living   1 1 1       1       Review of Systems  Constitutional: Negative for  fever.  HENT: Negative for sore throat and rhinorrhea.   Eyes: Negative for redness.  Respiratory: Negative for cough and shortness of breath.   Cardiovascular: Negative for chest pain.  Gastrointestinal: Positive for abdominal pain. Negative for nausea, vomiting, diarrhea, blood in stool and hematochezia.  Genitourinary: Negative for dysuria.  Musculoskeletal: Negative for myalgias.  Skin: Negative for rash.  Neurological: Negative for headaches.    Allergies  Review of patient's allergies indicates no known allergies.  Home Medications  No current outpatient prescriptions on file.  BP 93/54  Pulse 85  Temp 97.6 F (36.4 C)  Resp 16  Ht 5\' 2"  (1.575 m)  Wt 120 lb (54.432 kg)  BMI 21.95 kg/m2  SpO2 100%  Breastfeeding? Yes  Physical Exam  Nursing note and vitals reviewed. Constitutional: She appears well-developed and well-nourished.  HENT:  Head: Normocephalic and atraumatic.  Eyes: Conjunctivae are normal. Right eye exhibits no discharge. Left eye exhibits no discharge.  Neck: Normal range of motion. Neck supple.  Cardiovascular: Normal rate, regular rhythm and normal heart sounds.   Pulmonary/Chest: Effort normal and breath sounds normal.  Abdominal: Soft. There is tenderness in the right upper quadrant and epigastric area. There is no rigidity, no rebound, no guarding, no CVA tenderness, no tenderness at McBurney's point and negative Murphy's sign.    Neurological: She is alert.  Skin: Skin is warm and dry.  Psychiatric:  She has a normal mood and affect.    ED Course  Procedures (including critical care time)  Labs Reviewed  URINALYSIS, ROUTINE W REFLEX MICROSCOPIC - Abnormal; Notable for the following:    APPearance CLOUDY (*)    Leukocytes, UA LARGE (*)    All other components within normal limits  URINE MICROSCOPIC-ADD ON - Abnormal; Notable for the following:    Squamous Epithelial / LPF FEW (*)    Bacteria, UA FEW (*)    All other components within  normal limits  PREGNANCY, URINE  CBC  DIFFERENTIAL  COMPREHENSIVE METABOLIC PANEL   US Abdomen Complete  12/22/2011  *RADIOLOGY REPORT*  Clinical Data:  Right upper quadrant pain, nausea, vomiting.  COMPLETE ABDOMINAL ULTRASOUND  Comparison:  None.  Findings:  Gallbladder:  Gallstones are present.  No gallbladder wall thickening or pericholecystic fluid. Per the sonographer, positive sonographic Murphy's sign.  Common bile duct:  Measures 2 mm proximally, with normal limits. The distal duct is obscured by overlying bowel gas artifact.  Liver:  No focal lesion identified.  Within normal limits in parenchymal echogenicity.  IVC:  Poorly visualized due to overlying bowel gas artifact.  Pancreas:  No focal abnormality seen. Portions of the body and tail are obscured by overlying bowel gas artifact.  Spleen:  Measures 10.3 cm.  Normal echogenicity.  Right Kidney:  Measures 10.7 cm.  No focal lesion or hydronephrosis.  Left Kidney:  Measures 9.3 cm.  No hydronephrosis or focal abnormality.  Abdominal aorta:  No aneurysm identified. Measures up to 1.9 cm in diameter where seen. There are portions that were poorly visualized due to overlying bowel gas artifact.  IMPRESSION: Cholelithiasis without gallbladder wall thickening or pericholecystic fluid.  Per the sonographer, positive sonographic Murphy's sign.  Therefore, acute cholecystitis not excluded. Consider HIDA scan if clinically warranted.  Original Report Authenticated By: Waneta Martins, M.D.     1. Cholelithiasis     8:02 PM Patient seen and examined. Work-up initiated. Medications ordered.   Vital signs reviewed and are as follows: Filed Vitals:   12/22/11 1841  BP: 93/54  Pulse: 85  Temp: 97.6 F (36.4 C)  Resp: 16      Labs reviewed with patient. Informed of [place] results. Her pain is much improved after pain medication x 2. Abdominal re-exam continues with RUQ tenderness but no Murphy's sign. Patient tolerating PO's. Pt agrees to  discharge home with surgery referral, return if worsening.   Patient counseled on use of narcotic pain medications. Counseled not to combine these medications with others containing tylenol. Urged not to drink alcohol, drive, or perform any other activities that requires focus while taking these medications. The patient verbalizes understanding and agrees with the plan.  The patient was urged to return to the Emergency Department immediately with worsening of current symptoms, worsening abdominal pain, persistent vomiting, blood noted in stools, fever, or any other concerns. The patient verbalized understanding.      MDM  RUQ pain, likely 2/2 cholelithiasis. Pain controlled in ED. Positive sonographic Murphy sign, however no GB wall thickening, pericholecystic fluid, fever, elevated WBC count. Patient appears well, is tolerating PO's, pain is reasonably controlled and she should be able to follow-up with surgery as outpatient. Strict return instructions given.         Renne Crigler, [place] 12/22/11 2249

## 2011-12-22 NOTE — ED Provider Notes (Signed)
Medical screening examination/treatment/procedure(s) were performed by non-physician practitioner and as supervising physician I was immediately available for consultation/collaboration.   Aisa Schoeppner Y. Obdulio Mash, MD 12/22/11 2347 

## 2012-01-23 ENCOUNTER — Ambulatory Visit (INDEPENDENT_AMBULATORY_CARE_PROVIDER_SITE_OTHER): Payer: No Typology Code available for payment source | Admitting: Surgery

## 2012-01-23 ENCOUNTER — Encounter (INDEPENDENT_AMBULATORY_CARE_PROVIDER_SITE_OTHER): Payer: Self-pay | Admitting: Surgery

## 2012-01-23 VITALS — BP 116/78 | HR 76 | Temp 97.4°F | Resp 16 | Ht 62.0 in | Wt 120.0 lb

## 2012-01-23 DIAGNOSIS — K8050 Calculus of bile duct without cholangitis or cholecystitis without obstruction: Secondary | ICD-10-CM | POA: Insufficient documentation

## 2012-01-23 DIAGNOSIS — K8020 Calculus of gallbladder without cholecystitis without obstruction: Secondary | ICD-10-CM

## 2012-01-23 NOTE — Progress Notes (Signed)
Patient ID: Sara Ross, female   DOB: 21-Jul-1975, 36 y.o.   MRN: [id number]  No chief complaint on file.   HPI Sara Ross is a 36 y.o. female.   HPIPatient seen at the request of Dr. Maryellen Pile do to right upper quadrant pain. One month ago she was seen in the emergency room due to right upper quadrant pain and epigastric pain. The pain was severe and sharp in nature. No evidence of radiation. Ultrasound was done which showed gallstones. She has had intermittent attacks of pain like this for the last 6 months. No nausea or vomiting. She is eating what she wants now without pain and has had no further problems the last 4 weeks.  Past Medical History  Diagnosis Date  . Rheumatic fever   . Breast cyst     Past Surgical History  Procedure Date  . No past surgeries     Family History  Problem Relation Age of Onset  . Throat cancer Father     Social History History  Substance Use Topics  . Smoking status: Never Smoker   . Smokeless tobacco: Never Used  . Alcohol Use: 1.2 oz/week    2 Glasses of wine per week     occasional wine    No Known Allergies  No current outpatient prescriptions on file.    Review of Systems Review of Systems  Constitutional: Negative for fever, chills and unexpected weight change.  HENT: Negative for hearing loss, congestion, sore throat, trouble swallowing and voice change.   Eyes: Negative for visual disturbance.  Respiratory: Negative for cough and wheezing.   Cardiovascular: Negative for chest pain, palpitations and leg swelling.  Gastrointestinal: Negative for nausea, vomiting, abdominal pain, diarrhea, constipation, blood in stool, abdominal distention and anal bleeding.  Genitourinary: Negative for hematuria, vaginal bleeding and difficulty urinating.  Musculoskeletal: Negative for arthralgias.  Skin: Negative for rash and wound.  Neurological: Negative for seizures, syncope and headaches.  Hematological: Negative for adenopathy. Does  not bruise/bleed easily.  Psychiatric/Behavioral: Negative for confusion.    Blood pressure 116/78, pulse 76, temperature 97.4 F (36.3 C), resp. rate 16, height 5\' 2"  (1.575 m), weight 120 lb (54.432 kg).  Physical Exam Physical Exam  Constitutional: She is oriented to person, place, and time. She appears well-developed and well-nourished.  HENT:  Head: Normocephalic and atraumatic.  Eyes: EOM are normal. Pupils are equal, round, and reactive to light.  Neck: Normal range of motion. Neck supple.  Cardiovascular: Normal rate and regular rhythm.   Pulmonary/Chest: Effort normal and breath sounds normal.  Abdominal: Soft. Bowel sounds are normal. She exhibits no distension. There is no tenderness. There is no rebound and no guarding.  Musculoskeletal: Normal range of motion.  Neurological: She is alert and oriented to person, place, and time.  Skin: Skin is warm and dry.  Psychiatric: She has a normal mood and affect. Her behavior is normal. Judgment and thought content normal.    Data Reviewed  Clinical Data: Right upper quadrant pain, nausea, vomiting.  COMPLETE ABDOMINAL ULTRASOUND  Comparison: None.  Findings:  Gallbladder: Gallstones are present. No gallbladder wall  thickening or pericholecystic fluid. Per the sonographer, positive  sonographic Murphy's sign.  Common bile duct: Measures 2 mm proximally, with normal limits.  The distal duct is obscured by overlying bowel gas artifact.  Liver: No focal lesion identified. Within normal limits in  parenchymal echogenicity.  IVC: Poorly visualized due to overlying bowel gas artifact.  Pancreas: No focal abnormality seen. Portions of  the body and tail  are obscured by overlying bowel gas artifact.  Spleen: Measures 10.3 cm. Normal echogenicity.  Right Kidney: Measures 10.7 cm. No focal lesion or  hydronephrosis.  Left Kidney: Measures 9.3 cm. No hydronephrosis or focal  abnormality.  Abdominal aorta: No aneurysm identified.  Measures up to 1.9 cm in  diameter where seen. There are portions that were poorly visualized  due to overlying bowel gas artifact.  IMPRESSION:  Cholelithiasis without gallbladder wall thickening or  pericholecystic fluid. Per the sonographer, positive sonographic  Murphy's sign. Therefore, acute cholecystitis not excluded.  Consider HIDA scan if clinically warranted.   Assessment    Biliary colic    Plan    Discussed a history of gallstones and symptoms. She has had no further attacks for a month and is eating whatever she wants without pain. I gave her dietary information a low-fat diet as well as dairy products to avoid to decrease her risk of an attack. I recommend a laparoscopic cholecystectomy to her for treatment and discussed medical management as well with her today. She is breast-feeding an infant and wanted to wait  on having surgery until this process is over. I told herif  she is to worsen to contact us and we can move up surgery.       Enda Santo A. 01/23/2012, 11:21 AM

## 2012-01-23 NOTE — Patient Instructions (Signed)

## 2013-09-26 ENCOUNTER — Other Ambulatory Visit: Payer: Self-pay | Admitting: Family Medicine

## 2013-09-26 DIAGNOSIS — R109 Unspecified abdominal pain: Secondary | ICD-10-CM

## 2013-10-03 ENCOUNTER — Ambulatory Visit
Admission: RE | Admit: 2013-10-03 | Discharge: 2013-10-03 | Disposition: A | Discharge status: Home / Self Care | Payer: 59 | Source: Ambulatory Visit | Attending: Family Medicine | Admitting: Family Medicine

## 2013-10-03 DIAGNOSIS — R109 Unspecified abdominal pain: Secondary | ICD-10-CM

## 2013-10-24 ENCOUNTER — Encounter (INDEPENDENT_AMBULATORY_CARE_PROVIDER_SITE_OTHER): Payer: No Typology Code available for payment source | Admitting: Surgery

## 2013-11-07 ENCOUNTER — Encounter (INDEPENDENT_AMBULATORY_CARE_PROVIDER_SITE_OTHER): Payer: Self-pay | Admitting: Surgery

## 2013-11-07 ENCOUNTER — Ambulatory Visit (INDEPENDENT_AMBULATORY_CARE_PROVIDER_SITE_OTHER): Payer: Managed Care, Other (non HMO) | Admitting: Surgery

## 2013-11-07 VITALS — BP 100/70 | HR 70 | Resp 14 | Ht 62.0 in | Wt 126.0 lb

## 2013-11-07 DIAGNOSIS — K8020 Calculus of gallbladder without cholecystitis without obstruction: Secondary | ICD-10-CM

## 2013-11-07 DIAGNOSIS — K8050 Calculus of bile duct without cholangitis or cholecystitis without obstruction: Secondary | ICD-10-CM

## 2013-11-07 NOTE — Progress Notes (Signed)
Patient ID: Lacretia NicksLivia Spitzley, female   DOB: 03/16/1976, 38 y.o.   MRN: [id number]  No chief complaint on file.   HPI Lacretia NicksLivia Mowers is a 38 y.o. female.   HPIPatient seen at the request of Dr. Maryellen Pile[place] do to right upper quadrant pain. One month ago she was seen in the emergency room due to right upper quadrant pain and epigastric pain. The pain was severe and sharp in nature. No evidence of radiation. Ultrasound was done which showed gallstones. She was seen in 2013 and decided against surgery.  She has had a few more bouts of epigastric pain after eating but nothing severe until this past weekend. Had an episode of pain after a fatty meal and wine. She feels better today.  Past Medical History  Diagnosis Date  . Rheumatic fever   . Breast cyst     Past Surgical History  Procedure Laterality Date  . No past surgeries      Family History  Problem Relation Age of Onset  . Throat cancer Father     Social History History  Substance Use Topics  . Smoking status: Never Smoker   . Smokeless tobacco: Never Used  . Alcohol Use: 1.2 oz/week    2 Glasses of wine per week     Comment: occasional wine    No Known Allergies  No current outpatient prescriptions on file.   No current facility-administered medications for this visit.    Review of Systems Review of Systems  Constitutional: Negative for fever, chills and unexpected weight change.  HENT: Negative for hearing loss, congestion, sore throat, trouble swallowing and voice change.   Eyes: Negative for visual disturbance.  Respiratory: Negative for cough and wheezing.   Cardiovascular: Negative for chest pain, palpitations and leg swelling.  Gastrointestinal: Negative for nausea, vomiting, abdominal pain, diarrhea, constipation, blood in stool, abdominal distention and anal bleeding.  Genitourinary: Negative for hematuria, vaginal bleeding and difficulty urinating.  Musculoskeletal: Negative for arthralgias.  Skin: Negative for  rash and wound.  Neurological: Negative for seizures, syncope and headaches.  Hematological: Negative for adenopathy. Does not bruise/bleed easily.  Psychiatric/Behavioral: Negative for confusion.    Blood pressure 100/70, pulse 70, resp. rate 14, height 5\' 2"  (1.575 m), weight 126 lb (57.153 kg).  Physical Exam Physical Exam  Constitutional: She is oriented to person, place, and time. She appears well-developed and well-nourished.  HENT:  Head: Normocephalic and atraumatic.  Eyes: EOM are normal. Pupils are equal, round, and reactive to light.  Neck: Normal range of motion. Neck supple.  Cardiovascular: Normal rate and regular rhythm.   Pulmonary/Chest: Effort normal and breath sounds normal.  Abdominal: Soft. Bowel sounds are normal. She exhibits no distension. There is no tenderness. There is no rebound and no guarding.  Musculoskeletal: Normal range of motion.  Neurological: She is alert and oriented to person, place, and time.  Skin: Skin is warm and dry.  Psychiatric: She has a normal mood and affect. Her behavior is normal. Judgment and thought content normal.    Data Reviewed  Clinical Data: Right upper quadrant pain, nausea, vomiting.  COMPLETE ABDOMINAL ULTRASOUND  Comparison: None.  Findings:  Gallbladder: Gallstones are present. No gallbladder wall  thickening or pericholecystic fluid. Per the sonographer, positive  sonographic Murphy's sign.  Common bile duct: Measures 2 mm proximally, with normal limits.  The distal duct is obscured by overlying bowel gas artifact.  Liver: No focal lesion identified. Within normal limits in  parenchymal echogenicity.  IVC: Poorly visualized  due to overlying bowel gas artifact.  Pancreas: No focal abnormality seen. Portions of the body and tail  are obscured by overlying bowel gas artifact.  Spleen: Measures 10.3 cm. Normal echogenicity.  Right Kidney: Measures 10.7 cm. No focal lesion or  hydronephrosis.  Left Kidney: Measures  9.3 cm. No hydronephrosis or focal  abnormality.  Abdominal aorta: No aneurysm identified. Measures up to 1.9 cm in  diameter where seen. There are portions that were poorly visualized  due to overlying bowel gas artifact.  IMPRESSION:  Cholelithiasis without gallbladder wall thickening or  pericholecystic fluid. Per the sonographer, positive sonographic  Murphy's sign. Therefore, acute cholecystitis not excluded.  Consider HIDA scan if clinically warranted.   Assessment    Biliary colic    Plan    Discussed a history of gallstones and symptoms. She has had no further attacks for a month and is eating whatever she wants without pain. I gave her dietary information a low-fat diet as well as dairy products to avoid to decrease her risk of an attack. I recommend a laparoscopic cholecystectomy to her for treatment and discussed medical management as well with her today.    She is undecided about surgery and will think it over.  Discussed medical management as well.The procedure has been discussed with the patient. Operative and non operative treatments have been discussed. Risks of surgery include bleeding, infection,  Common bile duct injury,  Injury to the stomach,liver, colon,small intestine, abdominal wall,  Diaphragm,  Major blood vessels,  And the need for an open procedure.  Other risks include worsening of medical problems, death,  DVT and pulmonary embolism, and cardiovascular events.   Medical options have also been discussed. The patient has been informed of long term expectations of surgery and non surgical options,  The patient WILL CALL TO SCHEDULE WHEN READY.      Alysia Scism A. Galileo Colello 11/07/2013, 2:25 PM

## 2013-11-07 NOTE — Patient Instructions (Signed)
Laparoscopic Cholecystectomy °Laparoscopic cholecystectomy is surgery to remove the gallbladder. The gallbladder is located in the upper right part of the abdomen, behind the liver. It is a storage sac for bile produced in the liver. Bile aids in the digestion and absorption of fats. Cholecystectomy is often done for inflammation of the gallbladder (cholecystitis). This condition is usually caused by a buildup of gallstones (cholelithiasis) in your gallbladder. Gallstones can block the flow of bile, resulting in inflammation and pain. In severe cases, emergency surgery may be required. When emergency surgery is not required, you will have time to prepare for the procedure. °Laparoscopic surgery is an alternative to open surgery. Laparoscopic surgery has a shorter recovery time. Your common bile duct may also need to be examined during the procedure. If stones are found in the common bile duct, they may be removed. °LET YOUR HEALTH CARE PROVIDER KNOW ABOUT: °· Any allergies you have. °· All medicines you are taking, including vitamins, herbs, eye drops, creams, and over-the-counter medicines. °· Previous problems you or members of your family have had with the use of anesthetics. °· Any blood disorders you have. °· Previous surgeries you have had. °· Medical conditions you have. °RISKS AND COMPLICATIONS °Generally, this is a safe procedure. However, as with any procedure, complications can occur. Possible complications include: °· Infection. °· Damage to the common bile duct, nerves, arteries, veins, or other internal organs such as the stomach, liver, or intestines. °· Bleeding. °· A stone may remain in the common bile duct. °· A bile leak from the cyst duct that is clipped when your gallbladder is removed. °· The need to convert to open surgery, which requires a larger incision in the abdomen. This may be necessary if your surgeon thinks it is not safe to continue with a laparoscopic procedure. °BEFORE THE  PROCEDURE °· Ask your health care provider about changing or stopping any regular medicines. You will need to stop taking aspirin or blood thinners at least 5 days prior to surgery. °· Do not eat or drink anything after midnight the night before surgery. °· Let your health care provider know if you develop a cold or other infectious problem before surgery. °PROCEDURE  °· You will be given medicine to make you sleep through the procedure (general anesthetic). A breathing tube will be placed in your mouth. °· When you are asleep, your surgeon will make several small cuts (incisions) in your abdomen. °· A thin, lighted tube with a tiny camera on the end (laparoscope) is inserted through one of the small incisions. The camera on the laparoscope sends a picture to a TV screen in the operating room. This gives the surgeon a good view inside your abdomen. °· A gas will be pumped into your abdomen. This expands your abdomen so that the surgeon has more room to perform the surgery. °· Other tools needed for the procedure are inserted through the other incisions. The gallbladder is removed through one of the incisions. °· After the removal of your gallbladder, the incisions will be closed with stitches, staples, or skin glue. °AFTER THE PROCEDURE °· You will be taken to a recovery area where your progress will be checked often. °· You may be allowed to go home the same day if your pain is controlled and you can tolerate liquids. °Document Released: 06/30/2005 Document Revised: 04/20/2013 Document Reviewed: 02/09/2013 °[employer]® Patient Information ©2014 [employer]. ° °

## 2014-01-26 ENCOUNTER — Other Ambulatory Visit (INDEPENDENT_AMBULATORY_CARE_PROVIDER_SITE_OTHER): Payer: Self-pay | Admitting: Surgery

## 2014-05-10 ENCOUNTER — Encounter (HOSPITAL_COMMUNITY): Payer: Self-pay | Admitting: Pharmacy Technician

## 2014-05-12 ENCOUNTER — Other Ambulatory Visit (INDEPENDENT_AMBULATORY_CARE_PROVIDER_SITE_OTHER): Payer: Self-pay | Admitting: Surgery

## 2014-05-12 ENCOUNTER — Other Ambulatory Visit (HOSPITAL_COMMUNITY): Payer: Self-pay | Admitting: *Deleted

## 2014-05-12 NOTE — Pre-Procedure Instructions (Signed)
Sara NicksLivia Ross  05/12/2014   Your procedure is scheduled on:  Tuesday, May 23, 2014 at 9:45 AM.   Report to [facility] Entrance "A" Admitting Office at 7:45 AM.   Call this number if you have problems the morning of surgery: [phone]   Remember:   Do not eat food or drink liquids after midnight Monday, 05/22/14.   Take these medicines the morning of surgery with A SIP OF WATER: Tylenol if needed  Stop NSAIDS (Ibuprofen and Aleve) as of today.   Do not wear jewelry, make-up or nail polish.  Do not wear lotions, powders, or perfumes. You may wear deodorant.  Do not shave 48 hours prior to surgery.   Do not bring valuables to the hospital.  [facility] is not responsible                  for any belongings or valuables.               Contacts, dentures or bridgework may not be worn into surgery.  Leave suitcase in the car. After surgery it may be brought to your room.  For patients admitted to the hospital, discharge time is determined by your                treatment team.               Patients discharged the day of surgery will not be allowed to drive home.                 Special Instructions: [facility] - Preparing for Surgery  Before surgery, you can play an important role.  Because skin is not sterile, your skin needs to be as free of germs as possible.  You can reduce the number of germs on you skin by washing with CHG (chlorahexidine gluconate) soap before surgery.  CHG is an antiseptic cleaner which kills germs and bonds with the skin to continue killing germs even after washing.  Please DO NOT use if you have an allergy to CHG or antibacterial soaps.  If your skin becomes reddened/irritated stop using the CHG and inform your nurse when you arrive at Short Stay.  Do not shave (including legs and underarms) for at least 48 hours prior to the first CHG shower.  You may shave your face.  Please follow these instructions carefully:   1.  Shower with CHG Soap  the night before surgery and the                                morning of Surgery.  2.  If you choose to wash your hair, wash your hair first as usual with your       normal shampoo.  3.  After you shampoo, rinse your hair and body thoroughly to remove the                      Shampoo.  4.  Use CHG as you would any other liquid soap.  You can apply chg directly       to the skin and wash gently with scrungie or a clean washcloth.  5.  Apply the CHG Soap to your body ONLY FROM THE NECK DOWN.        Do not use on open wounds or open sores.  Avoid contact with your eyes, ears, mouth and genitals (private  parts).  Wash genitals (private parts) with your normal soap.  6.  Wash thoroughly, paying special attention to the area where your surgery        will be performed.  7.  Thoroughly rinse your body with warm water from the neck down.  8.  DO NOT shower/wash with your normal soap after using and rinsing off       the CHG Soap.  9.  Pat yourself dry with a clean towel.            10.  Wear clean pajamas.            11.  Place clean sheets on your bed the night of your first shower and do not        sleep with pets.  Day of Surgery  Do not apply any lotions the morning of surgery.  Please wear clean clothes to the hospital.     Please read over the following fact sheets that you were given: Pain Booklet, Coughing and Deep Breathing and Surgical Site Infection Prevention

## 2014-05-15 ENCOUNTER — Encounter (HOSPITAL_COMMUNITY): Payer: Self-pay

## 2014-05-15 ENCOUNTER — Encounter (HOSPITAL_COMMUNITY)
Admission: RE | Admit: 2014-05-15 | Discharge: 2014-05-15 | Disposition: A | Discharge status: Home / Self Care | Payer: PRIVATE HEALTH INSURANCE | Source: Ambulatory Visit | Attending: Surgery | Admitting: Surgery

## 2014-05-15 DIAGNOSIS — K8080 Other cholelithiasis without obstruction: Secondary | ICD-10-CM | POA: Diagnosis not present

## 2014-05-15 DIAGNOSIS — Z01812 Encounter for preprocedural laboratory examination: Secondary | ICD-10-CM | POA: Diagnosis present

## 2014-05-15 LAB — CBC
HCT: 39.5 % (ref 36.0–46.0)
HEMOGLOBIN: 13.5 g/dL (ref 12.0–15.0)
MCH: 30.7 pg (ref 26.0–34.0)
MCHC: 34.2 g/dL (ref 30.0–36.0)
MCV: 89.8 fL (ref 78.0–100.0)
PLATELETS: 249 10*3/uL (ref 150–400)
RBC: 4.4 MIL/uL (ref 3.87–5.11)
RDW: 12.9 % (ref 11.5–15.5)
WBC: 6 10*3/uL (ref 4.0–10.5)

## 2014-05-15 LAB — HCG, SERUM, QUALITATIVE: Preg, Serum: NEGATIVE

## 2014-05-22 MED ORDER — CEFAZOLIN SODIUM-DEXTROSE 2-3 GM-% IV SOLR
2.0000 g | INTRAVENOUS | Status: AC
  Administered 2014-05-23: 2 g via INTRAVENOUS
  Filled 2014-05-22: qty 50

## 2014-05-23 ENCOUNTER — Ambulatory Visit (HOSPITAL_COMMUNITY): Payer: PRIVATE HEALTH INSURANCE

## 2014-05-23 ENCOUNTER — Ambulatory Visit (HOSPITAL_COMMUNITY)
Admission: RE | Admit: 2014-05-23 | Discharge: 2014-05-23 | Disposition: A | Discharge status: Home / Self Care | Payer: PRIVATE HEALTH INSURANCE | Source: Ambulatory Visit | Attending: Surgery | Admitting: Surgery

## 2014-05-23 ENCOUNTER — Ambulatory Visit (HOSPITAL_COMMUNITY): Payer: PRIVATE HEALTH INSURANCE | Admitting: Anesthesiology

## 2014-05-23 ENCOUNTER — Encounter (HOSPITAL_COMMUNITY): Payer: Self-pay | Admitting: *Deleted

## 2014-05-23 ENCOUNTER — Encounter (HOSPITAL_COMMUNITY)
Admission: RE | Disposition: A | Discharge status: Home / Self Care | Payer: Self-pay | Source: Ambulatory Visit | Attending: Surgery

## 2014-05-23 DIAGNOSIS — K8020 Calculus of gallbladder without cholecystitis without obstruction: Secondary | ICD-10-CM

## 2014-05-23 DIAGNOSIS — K811 Chronic cholecystitis: Secondary | ICD-10-CM | POA: Diagnosis present

## 2014-05-23 DIAGNOSIS — K8010 Calculus of gallbladder with chronic cholecystitis without obstruction: Secondary | ICD-10-CM | POA: Diagnosis not present

## 2014-05-23 HISTORY — PX: CHOLECYSTECTOMY: SHX55

## 2014-05-23 SURGERY — LAPAROSCOPIC CHOLECYSTECTOMY WITH INTRAOPERATIVE CHOLANGIOGRAM
Anesthesia: General | Site: Abdomen

## 2014-05-23 MED ORDER — ONDANSETRON HCL 4 MG/2ML IJ SOLN
INTRAMUSCULAR | Status: AC
  Filled 2014-05-23: qty 2

## 2014-05-23 MED ORDER — LACTATED RINGERS IV SOLN
INTRAVENOUS | Status: DC
  Administered 2014-05-23 (×2): via INTRAVENOUS

## 2014-05-23 MED ORDER — ONDANSETRON HCL 4 MG/2ML IJ SOLN
INTRAMUSCULAR | Status: DC | PRN
Start: 2014-05-23 — End: 2014-05-23
  Administered 2014-05-23: 4 mg via INTRAVENOUS

## 2014-05-23 MED ORDER — LIDOCAINE HCL (CARDIAC) 20 MG/ML IV SOLN
INTRAVENOUS | Status: DC | PRN
  Administered 2014-05-23: 30 mg via INTRAVENOUS

## 2014-05-23 MED ORDER — SCOPOLAMINE 1 MG/3DAYS TD PT72
MEDICATED_PATCH | TRANSDERMAL | Status: AC
  Filled 2014-05-23: qty 1

## 2014-05-23 MED ORDER — ROCURONIUM BROMIDE 50 MG/5ML IV SOLN
INTRAVENOUS | Status: AC
  Filled 2014-05-23: qty 1

## 2014-05-23 MED ORDER — OXYCODONE HCL 5 MG PO TABS
ORAL_TABLET | ORAL | Status: AC
  Filled 2014-05-23: qty 1

## 2014-05-23 MED ORDER — KETOROLAC TROMETHAMINE 30 MG/ML IJ SOLN
INTRAMUSCULAR | Status: AC
  Filled 2014-05-23: qty 1

## 2014-05-23 MED ORDER — HYDROMORPHONE HCL 1 MG/ML IJ SOLN
0.2500 mg | INTRAMUSCULAR | Status: DC | PRN
  Administered 2014-05-23 (×4): 0.5 mg via INTRAVENOUS

## 2014-05-23 MED ORDER — SODIUM CHLORIDE 0.9 % IJ SOLN: 3.0000 mL | Freq: Two times a day (BID) | INTRAMUSCULAR | Status: DC

## 2014-05-23 MED ORDER — SODIUM CHLORIDE 0.9 % IV SOLN
INTRAVENOUS | Status: DC | PRN
  Administered 2014-05-23: 50 mL

## 2014-05-23 MED ORDER — NEOSTIGMINE METHYLSULFATE 10 MG/10ML IV SOLN
INTRAVENOUS | Status: AC
  Filled 2014-05-23: qty 1

## 2014-05-23 MED ORDER — SODIUM CHLORIDE 0.9 % IR SOLN
Status: DC | PRN
  Administered 2014-05-23: 1000 mL

## 2014-05-23 MED ORDER — HYDROMORPHONE HCL 1 MG/ML IJ SOLN
INTRAMUSCULAR | Status: AC
  Filled 2014-05-23: qty 1

## 2014-05-23 MED ORDER — SCOPOLAMINE 1 MG/3DAYS TD PT72
1.0000 | MEDICATED_PATCH | Freq: Once | TRANSDERMAL | Status: DC
  Administered 2014-05-23: 1.5 mg via TRANSDERMAL

## 2014-05-23 MED ORDER — FENTANYL CITRATE 0.05 MG/ML IJ SOLN
INTRAMUSCULAR | Status: DC | PRN
  Administered 2014-05-23: 50 ug via INTRAVENOUS
  Administered 2014-05-23: 100 ug via INTRAVENOUS
  Administered 2014-05-23 (×2): 50 ug via INTRAVENOUS

## 2014-05-23 MED ORDER — BUPIVACAINE-EPINEPHRINE 0.25% -1:200000 IJ SOLN
INTRAMUSCULAR | Status: DC | PRN
  Administered 2014-05-23: 30 mL

## 2014-05-23 MED ORDER — ACETAMINOPHEN 325 MG PO TABS: 650.0000 mg | ORAL_TABLET | ORAL | Status: DC | PRN

## 2014-05-23 MED ORDER — MIDAZOLAM HCL 2 MG/2ML IJ SOLN
INTRAMUSCULAR | Status: AC
  Filled 2014-05-23: qty 2

## 2014-05-23 MED ORDER — MIDAZOLAM HCL 5 MG/5ML IJ SOLN
INTRAMUSCULAR | Status: DC | PRN
  Administered 2014-05-23: 2 mg via INTRAVENOUS

## 2014-05-23 MED ORDER — GLYCOPYRROLATE 0.2 MG/ML IJ SOLN
INTRAMUSCULAR | Status: AC
  Filled 2014-05-23: qty 2

## 2014-05-23 MED ORDER — LIDOCAINE HCL (CARDIAC) 20 MG/ML IV SOLN
INTRAVENOUS | Status: AC
  Filled 2014-05-23: qty 5

## 2014-05-23 MED ORDER — SODIUM CHLORIDE 0.9 % IJ SOLN: 3.0000 mL | INTRAMUSCULAR | Status: DC | PRN

## 2014-05-23 MED ORDER — PROPOFOL 10 MG/ML IV BOLUS
INTRAVENOUS | Status: DC | PRN
  Administered 2014-05-23: 40 mg via INTRAVENOUS
  Administered 2014-05-23: 110 mg via INTRAVENOUS

## 2014-05-23 MED ORDER — DEXAMETHASONE SODIUM PHOSPHATE 4 MG/ML IJ SOLN
INTRAMUSCULAR | Status: AC
  Filled 2014-05-23: qty 1

## 2014-05-23 MED ORDER — CHLORHEXIDINE GLUCONATE 4 % EX LIQD
1.0000 "application " | Freq: Once | CUTANEOUS | Status: DC
  Filled 2014-05-23: qty 15

## 2014-05-23 MED ORDER — KETOROLAC TROMETHAMINE 30 MG/ML IJ SOLN
30.0000 mg | Freq: Four times a day (QID) | INTRAMUSCULAR | Status: DC
  Administered 2014-05-23: 30 mg via INTRAVENOUS

## 2014-05-23 MED ORDER — NEOSTIGMINE METHYLSULFATE 10 MG/10ML IV SOLN
INTRAVENOUS | Status: DC | PRN
  Administered 2014-05-23: 3 mg via INTRAVENOUS
  Administered 2014-05-23: 1 mg via INTRAVENOUS

## 2014-05-23 MED ORDER — ROCURONIUM BROMIDE 100 MG/10ML IV SOLN
INTRAVENOUS | Status: DC | PRN
  Administered 2014-05-23: 40 mg via INTRAVENOUS

## 2014-05-23 MED ORDER — ACETAMINOPHEN 650 MG RE SUPP: 650.0000 mg | RECTAL | Status: DC | PRN

## 2014-05-23 MED ORDER — OXYCODONE HCL 5 MG PO TABS
5.0000 mg | ORAL_TABLET | Freq: Once | ORAL | Status: AC | PRN
  Administered 2014-05-23: 5 mg via ORAL

## 2014-05-23 MED ORDER — OXYCODONE HCL 5 MG/5ML PO SOLN: 5.0000 mg | Freq: Once | ORAL | Status: AC | PRN

## 2014-05-23 MED ORDER — BUPIVACAINE-EPINEPHRINE (PF) 0.25% -1:200000 IJ SOLN
INTRAMUSCULAR | Status: AC
  Filled 2014-05-23: qty 30

## 2014-05-23 MED ORDER — OXYCODONE HCL 5 MG PO TABS: 5.0000 mg | ORAL_TABLET | ORAL | Status: DC | PRN

## 2014-05-23 MED ORDER — MORPHINE SULFATE 2 MG/ML IJ SOLN: 2.0000 mg | INTRAMUSCULAR | Status: DC | PRN

## 2014-05-23 MED ORDER — HEMOSTATIC AGENTS (NO CHARGE) OPTIME
TOPICAL | Status: DC | PRN
  Administered 2014-05-23 (×2): 1 via TOPICAL

## 2014-05-23 MED ORDER — OXYCODONE-ACETAMINOPHEN 5-325 MG PO TABS: 1.0000 | ORAL_TABLET | ORAL | Status: DC | PRN

## 2014-05-23 MED ORDER — DEXAMETHASONE SODIUM PHOSPHATE 4 MG/ML IJ SOLN
INTRAMUSCULAR | Status: DC | PRN
  Administered 2014-05-23: 4 mg via INTRAVENOUS

## 2014-05-23 MED ORDER — SODIUM CHLORIDE 0.9 % IV SOLN: 250.0000 mL | INTRAVENOUS | Status: DC | PRN

## 2014-05-23 MED ORDER — FENTANYL CITRATE 0.05 MG/ML IJ SOLN
INTRAMUSCULAR | Status: AC
  Filled 2014-05-23: qty 5

## 2014-05-23 MED ORDER — PROPOFOL 10 MG/ML IV BOLUS
INTRAVENOUS | Status: AC
  Filled 2014-05-23: qty 20

## 2014-05-23 MED ORDER — 0.9 % SODIUM CHLORIDE (POUR BTL) OPTIME
TOPICAL | Status: DC | PRN
  Administered 2014-05-23: 1000 mL

## 2014-05-23 MED ORDER — GLYCOPYRROLATE 0.2 MG/ML IJ SOLN
INTRAMUSCULAR | Status: DC | PRN
  Administered 2014-05-23: 0.2 mg via INTRAVENOUS
  Administered 2014-05-23: 0.4 mg via INTRAVENOUS

## 2014-05-23 SURGICAL SUPPLY — 47 items
APPLIER CLIP ROT 10 11.4 M/L (STAPLE) ×3
BLADE SURG ROTATE 9660 (MISCELLANEOUS) IMPLANT
CANISTER SUCTION 2500CC (MISCELLANEOUS) ×3 IMPLANT
CHLORAPREP W/TINT 26ML (MISCELLANEOUS) ×3 IMPLANT
CLIP APPLIE ROT 10 11.4 M/L (STAPLE) ×1 IMPLANT
COVER MAYO STAND STRL (DRAPES) ×3 IMPLANT
COVER SURGICAL LIGHT HANDLE (MISCELLANEOUS) ×3 IMPLANT
DECANTER SPIKE VIAL GLASS SM (MISCELLANEOUS) ×9 IMPLANT
DERMABOND ADVANCED (GAUZE/BANDAGES/DRESSINGS) ×2
DERMABOND ADVANCED .7 DNX12 (GAUZE/BANDAGES/DRESSINGS) ×1 IMPLANT
DRAPE C-ARM 42X72 X-RAY (DRAPES) ×3 IMPLANT
DRAPE LAPAROSCOPIC ABDOMINAL (DRAPES) ×3 IMPLANT
DRAPE UTILITY 15X26 W/TAPE STR (DRAPE) ×6 IMPLANT
DRAPE WARM FLUID 44X44 (DRAPE) ×3 IMPLANT
ELECT REM PT RETURN 9FT ADLT (ELECTROSURGICAL) ×3
ELECTRODE REM PT RTRN 9FT ADLT (ELECTROSURGICAL) ×1 IMPLANT
GLOVE BIO SURGEON STRL SZ7.5 (GLOVE) ×3 IMPLANT
GLOVE BIO SURGEON STRL SZ8 (GLOVE) ×3 IMPLANT
GLOVE BIOGEL PI IND STRL 7.0 (GLOVE) ×1 IMPLANT
GLOVE BIOGEL PI IND STRL 8 (GLOVE) ×1 IMPLANT
GLOVE BIOGEL PI INDICATOR 7.0 (GLOVE) ×2
GLOVE BIOGEL PI INDICATOR 8 (GLOVE) ×2
GLOVE ECLIPSE 7.5 STRL STRAW (GLOVE) ×3 IMPLANT
GLOVE SURG SS PI 7.0 STRL IVOR (GLOVE) ×3 IMPLANT
GOWN STRL REUS W/ TWL LRG LVL3 (GOWN DISPOSABLE) ×4 IMPLANT
GOWN STRL REUS W/ TWL XL LVL3 (GOWN DISPOSABLE) ×1 IMPLANT
GOWN STRL REUS W/TWL LRG LVL3 (GOWN DISPOSABLE) ×8
GOWN STRL REUS W/TWL XL LVL3 (GOWN DISPOSABLE) ×2
HEMOSTAT SNOW SURGICEL 2X4 (HEMOSTASIS) ×6 IMPLANT
KIT BASIN OR (CUSTOM PROCEDURE TRAY) ×3 IMPLANT
KIT ROOM TURNOVER OR (KITS) ×3 IMPLANT
NS IRRIG 1000ML POUR BTL (IV SOLUTION) ×6 IMPLANT
PAD ARMBOARD 7.5X6 YLW CONV (MISCELLANEOUS) ×3 IMPLANT
POUCH SPECIMEN RETRIEVAL 10MM (ENDOMECHANICALS) ×3 IMPLANT
SCISSORS LAP 5X35 DISP (ENDOMECHANICALS) ×3 IMPLANT
SET CHOLANGIOGRAPH 5 50 .035 (SET/KITS/TRAYS/PACK) ×3 IMPLANT
SET IRRIG TUBING LAPAROSCOPIC (IRRIGATION / IRRIGATOR) ×3 IMPLANT
SLEEVE ENDOPATH XCEL 5M (ENDOMECHANICALS) ×3 IMPLANT
SPECIMEN JAR SMALL (MISCELLANEOUS) ×3 IMPLANT
SUT MNCRL AB 4-0 PS2 18 (SUTURE) ×6 IMPLANT
TOWEL OR 17X24 6PK STRL BLUE (TOWEL DISPOSABLE) ×3 IMPLANT
TOWEL OR 17X26 10 PK STRL BLUE (TOWEL DISPOSABLE) ×3 IMPLANT
TRAY LAPAROSCOPIC (CUSTOM PROCEDURE TRAY) ×3 IMPLANT
TROCAR XCEL BLUNT TIP 100MML (ENDOMECHANICALS) ×3 IMPLANT
TROCAR XCEL NON-BLD 11X100MML (ENDOMECHANICALS) ×3 IMPLANT
TROCAR XCEL NON-BLD 5MMX100MML (ENDOMECHANICALS) ×3 IMPLANT
TUBING INSUFFLATION (TUBING) ×3 IMPLANT

## 2014-05-23 NOTE — Discharge Instructions (Signed)
[facility] ______[facility] °LAPAROSCOPIC SURGERY: POST OP INSTRUCTIONS °Always review your discharge instruction sheet given to you by the facility where your surgery was performed. °IF YOU HAVE DISABILITY OR FAMILY LEAVE FORMS, YOU MUST BRING THEM TO THE OFFICE FOR PROCESSING.   °DO NOT GIVE THEM TO YOUR DOCTOR. ° °1. A prescription for pain medication may be given to you upon discharge.  Take your pain medication as prescribed, if needed.  If narcotic pain medicine is not needed, then you may take acetaminophen (Tylenol) or ibuprofen (Advil) as needed. °2. Take your usually prescribed medications unless otherwise directed. °3. If you need a refill on your pain medication, please contact your pharmacy.  They will contact our office to request authorization. Prescriptions will not be filled after 5pm or on week-ends. °4. You should follow a light diet the first few days after arrival home, such as soup and crackers, etc.  Be sure to include lots of fluids daily. °5. Most patients will experience some swelling and bruising in the area of the incisions.  Ice packs will help.  Swelling and bruising can take several days to resolve.  °6. It is common to experience some constipation if taking pain medication after surgery.  Increasing fluid intake and taking a stool softener (such as Colace) will usually help or prevent this problem from occurring.  A mild laxative (Milk of Magnesia or Miralax) should be taken according to package instructions if there are no bowel movements after 48 hours. °7. Unless discharge instructions indicate otherwise, you may remove your bandages 24-48 hours after surgery, and you may shower at that time.  You may have steri-strips (small skin tapes) in place directly over the incision.  These strips should be left on the skin for 7-10 days.  If your surgeon used skin glue on the incision, you may shower in 24 hours.  The glue will flake off over the next 2-3 weeks.  Any sutures or  staples will be removed at the office during your follow-up visit. °8. ACTIVITIES:  You may resume regular (light) daily activities beginning the next day--such as daily self-care, walking, climbing stairs--gradually increasing activities as tolerated.  You may have sexual intercourse when it is comfortable.  Refrain from any heavy lifting or straining until approved by your doctor. °a. You may drive when you are no longer taking prescription pain medication, you can comfortably wear a seatbelt, and you can safely maneuver your car and apply brakes. °b. RETURN TO WORK:  __________________________________________________________ °9. You should see your doctor in the office for a follow-up appointment approximately 2-3 weeks after your surgery.  Make sure that you call for this appointment within a day or two after you arrive home to insure a convenient appointment time. °10. OTHER INSTRUCTIONS: __________________________________________________________________________________________________________________________ __________________________________________________________________________________________________________________________ °WHEN TO CALL YOUR DOCTOR: °1. Fever over 101.0 °2. Inability to urinate °3. Continued bleeding from incision. °4. Increased pain, redness, or drainage from the incision. °5. Increasing abdominal pain ° °The clinic staff is available to answer your questions during regular business hours.  Please don’t hesitate to call and ask to speak to one of the nurses for clinical concerns.  If you have a medical emergency, go to the nearest emergency room or call 911.  A surgeon from [facility] is always on call at the hospital. °[address], [address], [place], [place]  [address] ? [address] [address], [place], [place]   [address] °[phone] ? [phone] ? FAX [phone] °Web site:   [email or web address] ° ° ° ° °General Anesthesia, Care After °Refer to this sheet  in the next few weeks. These instructions provide you with information on caring for yourself after your procedure. Your health care provider may also give you more specific instructions. Your treatment has been planned according to current medical practices, but problems sometimes occur. Call your health care provider if you have any problems or questions after your procedure. °WHAT TO EXPECT AFTER THE PROCEDURE °After the procedure, it is typical to experience: °· Sleepiness. °· Nausea and vomiting. °HOME CARE INSTRUCTIONS °· For the first 24 hours after general anesthesia: °¨ Have a responsible person with you. °¨ Do not drive a car. If you are alone, do not take public transportation. °¨ Do not drink alcohol. °¨ Do not take medicine that has not been prescribed by your health care provider. °¨ Do not sign important papers or make important decisions. °¨ You may resume a normal diet and activities as directed by your health care provider. °· Change bandages (dressings) as directed. °· If you have questions or problems that seem related to general anesthesia, call the hospital and ask for the anesthetist or anesthesiologist on call. °SEEK MEDICAL CARE IF: °· You have nausea and vomiting that continue the day after anesthesia. °· You develop a rash. °SEEK IMMEDIATE MEDICAL CARE IF:  °· You have difficulty breathing. °· You have chest pain. °· You have any allergic problems. °Document Released: 10/06/2000 Document Revised: 07/05/2013 Document Reviewed: 01/13/2013 °Exit[employer]® Patient Information ©2015 [employer]. This information is not intended to replace advice given to you by your health care provider. Make sure you discuss any questions you have with your health care provider. ° °

## 2014-05-23 NOTE — Transfer of Care (Signed)
Immediate Anesthesia Transfer of Care Note  Patient: Sara NicksLivia Bame  Procedure(s) Performed: Procedure(s): LAPAROSCOPIC CHOLECYSTECTOMY WITH INTRAOPERATIVE CHOLANGIOGRAM (N/A)  Patient Location: PACU  Anesthesia Type:General  Level of Consciousness: awake, alert , oriented and patient cooperative  Airway & Oxygen Therapy: Patient Spontanous Breathing and Patient connected to nasal cannula oxygen  Post-op Assessment: Report given to PACU RN, Post -op Vital signs reviewed and stable and Patient moving all extremities  Post vital signs: Reviewed and stable  Complications: No apparent anesthesia complications

## 2014-05-23 NOTE — Anesthesia Procedure Notes (Signed)
Procedure Name: Intubation Date/Time: 05/23/2014 9:11 AM Performed by: Jerilee HohMUMM, [place] Silveria N Pre-anesthesia Checklist: Patient identified, Emergency Drugs available, Suction available and Patient being monitored Patient Re-evaluated:Patient Re-evaluated prior to inductionOxygen Delivery Method: Circle system utilized Preoxygenation: Pre-oxygenation with 100% oxygen Intubation Type: IV induction Ventilation: Mask ventilation without difficulty Laryngoscope Size: Mac and 3 Grade View: Grade I Tube type: Oral Tube size: 7.0 mm Number of attempts: 1 Airway Equipment and Method: Stylet Placement Confirmation: ETT inserted through vocal cords under direct vision,  breath sounds checked- equal and bilateral and positive ETCO2 Secured at: 21 cm Tube secured with: Tape Dental Injury: Teeth and Oropharynx as per pre-operative assessment

## 2014-05-23 NOTE — Anesthesia Preprocedure Evaluation (Addendum)
Anesthesia Evaluation  Patient identified by MRN, date of birth, ID band Patient awake    Reviewed: Allergy & Precautions, H&P , NPO status , Patient's Chart, lab work & pertinent test results  Airway Mallampati: I  TM Distance: >3 FB Neck ROM: Full    Dental no notable dental hx. (+) Teeth Intact, Dental Advisory Given   Pulmonary neg pulmonary ROS,  breath sounds clear to auscultation  Pulmonary exam normal       Cardiovascular negative cardio ROS  Rhythm:Regular Rate:Normal     Neuro/Psych  Headaches, negative psych ROS   GI/Hepatic negative GI ROS, Neg liver ROS,   Endo/Other  negative endocrine ROS  Renal/GU negative Renal ROS  negative genitourinary   Musculoskeletal   Abdominal   Peds  Hematology negative hematology ROS (+)   Anesthesia Other Findings   Reproductive/Obstetrics negative OB ROS                            Anesthesia Physical Anesthesia Plan  ASA: II  Anesthesia Plan: General   Post-op Pain Management:    Induction: Intravenous  Airway Management Planned: Oral ETT  Additional Equipment:   Intra-op Plan:   Post-operative Plan: Extubation in OR  Informed Consent: I have reviewed the patients History and Physical, chart, labs and discussed the procedure including the risks, benefits and alternatives for the proposed anesthesia with the patient or authorized representative who has indicated his/her understanding and acceptance.   Dental advisory given  Plan Discussed with: CRNA  Anesthesia Plan Comments:        Anesthesia Quick Evaluation

## 2014-05-23 NOTE — Interval H&P Note (Signed)
History and Physical Interval Note:  05/23/2014 8:32 AM  [name]  has presented today for surgery, with the diagnosis of biliary colic  The various methods of treatment have been discussed with the patient and family. After consideration of risks, benefits and other options for treatment, the patient has consented to  Procedure(s): LAPAROSCOPIC CHOLECYSTECTOMY WITH INTRAOPERATIVE CHOLANGIOGRAM (N/A) as a surgical intervention .  The patient's history has been reviewed, patient examined, no change in status, stable for surgery.  I have reviewed the patient's chart and labs.  Questions were answered to the patient's satisfaction.     Kathleen Tamm A.

## 2014-05-23 NOTE — Anesthesia Postprocedure Evaluation (Signed)
Anesthesia Post Note  Patient: Pharmacist, communityLivia Ross  Procedure(s) Performed: Procedure(s) (LRB): LAPAROSCOPIC CHOLECYSTECTOMY WITH INTRAOPERATIVE CHOLANGIOGRAM (N/A)  Anesthesia type: General  Patient location: PACU  Post pain: Pain level controlled and Adequate analgesia  Post assessment: Post-op Vital signs reviewed, Patient's Cardiovascular Status Stable, Respiratory Function Stable, Patent Airway and Pain level controlled  Last Vitals:  Filed Vitals:   05/23/14 1110  BP:   Pulse: 82  Temp:   Resp: 23    Post vital signs: Reviewed and stable  Level of consciousness: awake, alert  and oriented  Complications: No apparent anesthesia complications

## 2014-05-23 NOTE — Op Note (Signed)
Laparoscopic Cholecystectomy with IOC Procedure Note  Indications: This patient presents with symptomatic gallbladder disease and will undergo laparoscopic cholecystectomy.The procedure has been discussed with the patient. Operative and non operative treatments have been discussed. Risks of surgery include bleeding, infection,  Common bile duct injury,  Injury to the stomach,liver, colon,small intestine, abdominal wall,  Diaphragm,  Major blood vessels,  And the need for an open procedure.  Other risks include worsening of medical problems, death,  DVT and pulmonary embolism, and cardiovascular events.   Medical options have also been discussed. The patient has been informed of long term expectations of surgery and non surgical options,  The patient agrees to proceed.    Pre-operative Diagnosis: Chronic cholecystitis  Post-operative Diagnosis: Same  Surgeon: Khary Schaben A.   Assistants: OR staff  Anesthesia: General endotracheal anesthesia and Local anesthesia 0.25.% bupivacaine, with epinephrine  ASA Class: 2  Procedure Details  The patient was seen again in the Holding Room. The risks, benefits, complications, treatment options, and expected outcomes were discussed with the patient. The possibilities of reaction to medication, pulmonary aspiration, perforation of viscus, bleeding, recurrent infection, finding a normal gallbladder, the need for additional procedures, failure to diagnose a condition, the possible need to convert to an open procedure, and creating a complication requiring transfusion or operation were discussed with the patient. The patient and/or family concurred with the proposed plan, giving informed consent. The site of surgery properly noted/marked. The patient was taken to Operating Room, identified as Sara NicksLivia Dase and the procedure verified as Laparoscopic Cholecystectomy with Intraoperative Cholangiograms. A Time Out was held and the above information confirmed.  Prior  to the induction of general anesthesia, antibiotic prophylaxis was administered. General endotracheal anesthesia was then administered and tolerated well. After the induction, the abdomen was prepped in the usual sterile fashion. The patient was positioned in the supine position with the left arm comfortably tucked, along with some reverse Trendelenburg.  Local anesthetic agent was injected into the skin near the umbilicus and an incision made. The midline fascia was incised and the Hasson technique was used to introduce a 12 mm port under direct vision. It was secured with a figure of eight Vicryl suture placed in the usual fashion. Pneumoperitoneum was then created with CO2 and tolerated well without any adverse changes in the patient's vital signs. Additional trocars were introduced under direct vision with an 11 mm trocar in the epigastrium and 2 5 mm trocars in the right upper quadrant. All skin incisions were infiltrated with a local anesthetic agent before making the incision and placing the trocars.   The gallbladder was identified, the fundus grasped and retracted cephalad. Adhesions were lysed bluntly and with the electrocautery where indicated, taking care not to injure any adjacent organs or viscus. The infundibulum was grasped and retracted laterally, exposing the peritoneum overlying the triangle of Calot. This was then divided and exposed in a blunt fashion. The cystic duct was clearly identified and bluntly dissected circumferentially. The junctions of the gallbladder, cystic duct and common bile duct were clearly identified prior to the division of any linear structure.   An incision was made in the cystic duct and the cholangiogram catheter introduced. The catheter was secured using an endoclip. The study showed no stones and good visualization of the distal and proximal biliary tree. The catheter was then removed.   The cystic duct was then  ligated with surgical clips  on the patient side  and  clipped on the gallbladder side and divided.  The cystic artery was identified, dissected free, ligated with clips and divided as well. Posterior cystic artery clipped and divided.  The gallbladder was dissected from the liver bed in retrograde fashion with the electrocautery. The gallbladder was removed. The liver bed was irrigated and inspected. Hemostasis was achieved with the electrocautery. Copious irrigation was utilized and was repeatedly aspirated until clear all particulate matter. Hemostasis was achieved with no signs  Of bleeding or bile leakage.  Pneumoperitoneum was completely reduced after viewing removal of the trocars under direct vision. The wound was thoroughly irrigated and the fascia was then closed with a figure of eight suture; the skin was then closed with 4 O monocryl  and a sterile dressing was applied.  Instrument, sponge, and needle counts were correct at closure and at the conclusion of the case.   Findings: Cholecystitis with Cholelithiasis  Estimated Blood Loss: less than 100 mL         Drains: none          Total IV Fluids:  600 mL         Specimens: Gallbladder           Complications: None; patient tolerated the procedure well.         Disposition: PACU - hemodynamically stable.         Condition: stable

## 2014-05-23 NOTE — H&P (Signed)
Diagnoses     Biliary colic - Primary    ICD-9-CM: 574.20 ICD-10-CM: K80.50       Reason for Visit     Reason for Visit History       Current Vitals  Most recent update: 11/07/2013 2:10 PM by Brennan BaileyMichelle Brooks, CMA    BP Pulse Resp Ht Wt BMI    100/70 mmHg 70 14 5\' 2"  (1.575 m) 126 lb (57.153 kg) 23.04 kg/m2       Progress Notes      Harriette Bouillonhomas Simmie Camerer, MD at 11/07/2013 2:25 PM     Status: Signed       Expand All Collapse All   Patient ID: Sara Ross Vanderburg, female DOB: 06/20/1976, 38 y.o. MRN: [id number]  No chief complaint on file.   HPI Sara Ross Wassmer is a 38 y.o. female.  HPIPatient seen at the request of Dr. Maryellen PileShelburne do to right upper quadrant pain. One month ago she was seen in the emergency room due to right upper quadrant pain and epigastric pain. The pain was severe and sharp in nature. No evidence of radiation. Ultrasound was done which showed gallstones. She was seen in 2013 and decided against surgery. She has had a few more bouts of epigastric pain after eating but nothing severe until this past weekend. Had an episode of pain after a fatty meal and wine. She feels better today.  Past Medical History  Diagnosis Date  . Rheumatic fever   . Breast cyst     Past Surgical History  Procedure Laterality Date  . No past surgeries      Family History  Problem Relation Age of Onset  . Throat cancer Father     Social History History  Substance Use Topics  . Smoking status: Never Smoker   . Smokeless tobacco: Never Used  . Alcohol Use: 1.2 oz/week    2 Glasses of wine per week     Comment: occasional wine    No Known Allergies  No current outpatient prescriptions on file.   No current facility-administered medications for this visit.    Review of Systems Review of Systems  Constitutional: Negative for fever, chills and unexpected weight change.  HENT: Negative for hearing loss,  congestion, sore throat, trouble swallowing and voice change.  Eyes: Negative for visual disturbance.  Respiratory: Negative for cough and wheezing.  Cardiovascular: Negative for chest pain, palpitations and leg swelling.  Gastrointestinal: Negative for nausea, vomiting, abdominal pain, diarrhea, constipation, blood in stool, abdominal distention and anal bleeding.  Genitourinary: Negative for hematuria, vaginal bleeding and difficulty urinating.  Musculoskeletal: Negative for arthralgias.  Skin: Negative for rash and wound.  Neurological: Negative for seizures, syncope and headaches.  Hematological: Negative for adenopathy. Does not bruise/bleed easily.  Psychiatric/Behavioral: Negative for confusion.    Blood pressure 100/70, pulse 70, resp. rate 14, height 5\' 2"  (1.575 m), weight 126 lb (57.153 kg).  Physical Exam Physical Exam  Constitutional: She is oriented to person, place, and time. She appears well-developed and well-nourished.  HENT:  Head: Normocephalic and atraumatic.  Eyes: EOM are normal. Pupils are equal, round, and reactive to light.  Neck: Normal range of motion. Neck supple.  Cardiovascular: Normal rate and regular rhythm.  Pulmonary/Chest: Effort normal and breath sounds normal.  Abdominal: Soft. Bowel sounds are normal. She exhibits no distension. There is no tenderness. There is no rebound and no guarding.  Musculoskeletal: Normal range of motion.  Neurological: She is alert and oriented to person, place, and time.  Skin: Skin is warm and dry.  Psychiatric: She has a normal mood and affect. Her behavior is normal. Judgment and thought content normal.    Data Reviewed  Clinical Data: Right upper quadrant pain, nausea, vomiting.  COMPLETE ABDOMINAL ULTRASOUND  Comparison: None.  Findings:  Gallbladder: Gallstones are present. No gallbladder wall  thickening or pericholecystic fluid. Per the sonographer, positive  sonographic Murphy's sign.  Common  bile duct: Measures 2 mm proximally, with normal limits.  The distal duct is obscured by overlying bowel gas artifact.  Liver: No focal lesion identified. Within normal limits in  parenchymal echogenicity.  IVC: Poorly visualized due to overlying bowel gas artifact.  Pancreas: No focal abnormality seen. Portions of the body and tail  are obscured by overlying bowel gas artifact.  Spleen: Measures 10.3 cm. Normal echogenicity.  Right Kidney: Measures 10.7 cm. No focal lesion or  hydronephrosis.  Left Kidney: Measures 9.3 cm. No hydronephrosis or focal  abnormality.  Abdominal aorta: No aneurysm identified. Measures up to 1.9 cm in  diameter where seen. There are portions that were poorly visualized  due to overlying bowel gas artifact.  IMPRESSION:  Cholelithiasis without gallbladder wall thickening or  pericholecystic fluid. Per the sonographer, positive sonographic  Murphy's sign. Therefore, acute cholecystitis not excluded.  Consider HIDA scan if clinically warranted.   Assessment    Biliary colic    Plan    Discussed a history of gallstones and symptoms. She has had no further attacks for a month and is eating whatever she wants without pain. I gave her dietary information a low-fat diet as well as dairy products to avoid to decrease her risk of an attack. I recommend a laparoscopic cholecystectomy to her for treatment and discussed medical management as well with her today.    She is undecided about surgery and will think it over. Discussed medical management as well.The procedure has been discussed with the patient. Operative and non operative treatments have been discussed. Risks of surgery include bleeding, infection, Common bile duct injury, Injury to the stomach,liver, colon,small intestine, abdominal wall, Diaphragm, Major blood vessels, And the need for an open procedure. Other risks include worsening of medical problems, death, DVT and pulmonary  embolism, and cardiovascular events. Medical options have also been discussed. The patient has been informed of long term expectations of surgery and non surgical options, The patient WILL CALL TO SCHEDULE WHEN READY.    Arthurine Oleary A. Artie Takayama 11/07/2013, 2:25 PM                Therapy Notes     No notes of this type exist for this encounter.     Not recorded        Patient Instructions     Laparoscopic Cholecystectomy Laparoscopic cholecystectomy is surgery to remove the gallbladder. The gallbladder is located in the upper right part of the abdomen, behind the liver. It is a storage sac for bile produced in the liver. Bile aids in the digestion and absorption of fats. Cholecystectomy is often done for inflammation of the gallbladder (cholecystitis). This condition is usually caused by a buildup of gallstones (cholelithiasis) in your gallbladder. Gallstones can block the flow of bile, resulting in inflammation and pain. In severe cases, emergency surgery may be required. When emergency surgery is not required, you will have time to prepare for the procedure. Laparoscopic surgery is an alternative to open surgery. Laparoscopic surgery has a shorter recovery time. Your common bile duct may also need to be examined  during the procedure. If stones are found in the common bile duct, they may be removed. LET [facility] CARE PROVIDER KNOW ABOUT:  Any allergies you have.  All medicines you are taking, including vitamins, herbs, eye drops, creams, and over-the-counter medicines.  Previous problems you or members of your family have had with the use of anesthetics.  Any blood disorders you have.  Previous surgeries you have had.  Medical conditions you have. RISKS AND COMPLICATIONS Generally, this is a safe procedure. However, as with any procedure, complications can occur. Possible complications include:  Infection.  Damage to the common bile duct, nerves, arteries,  veins, or other internal organs such as the stomach, liver, or intestines.  Bleeding.  A stone may remain in the common bile duct.  A bile leak from the cyst duct that is clipped when your gallbladder is removed.  The need to convert to open surgery, which requires a larger incision in the abdomen. This may be necessary if your surgeon thinks it is not safe to continue with a laparoscopic procedure. BEFORE THE PROCEDURE  Ask your health care provider about changing or stopping any regular medicines. You will need to stop taking aspirin or blood thinners at least 5 days prior to surgery.  Do not eat or drink anything after midnight the night before surgery.  Let your health care provider know if you develop a cold or other infectious problem before surgery. PROCEDURE   You will be given medicine to make you sleep through the procedure (general anesthetic). A breathing tube will be placed in your mouth.  When you are asleep, your surgeon will make several small cuts (incisions) in your abdomen.  A thin, lighted tube with a tiny camera on the end (laparoscope) is inserted through one of the small incisions. The camera on the laparoscope sends a picture to a TV screen in the operating room. This gives the surgeon a good view inside your abdomen.  A gas will be pumped into your abdomen. This expands your abdomen so that the surgeon has more room to perform the surgery.  Other tools needed for the procedure are inserted through the other incisions. The gallbladder is removed through one of the incisions.  After the removal of your gallbladder, the incisions will be closed with stitches, staples, or skin glue. AFTER THE PROCEDURE  You will be taken to a recovery area where your progress will be checked often.  You may be allowed to go home the same day if your pain is controlled and you can tolerate liquids. Document Released: 06/30/2005 Document Revised: 04/20/2013 Document Reviewed:  02/09/2013 [facility] Patient Information 2014 [place], [place].        Referring Provider     Murrell Converse     Other Encounter Related Information     Allergies & Medications    Problem List    History    Patient-Entered Questionnaires      Level of Service     PR OFFICE OUTPATIENT VISIT 25 MINUTES [99214]         All Flowsheet Templates (all recorded)     Anthropometrics    Custom Formula Data    Encounter Vitals      Referring Provider     Murrell Converse     All Charges for This Encounter     Code Description Service Date Service Provider Modifiers Qty    99214 PR OFFICE OUTPATIENT VISIT 25 MINUTES 11/07/2013 Harriette Bouillon, MD  1  Routing History     There are no sent or routed communications associated with this encounter.     AVS Reports     Date/Time Report Action User    11/07/2013 2:25 PM After Visit Summary Printed Harriette Bouillon, MD      Smoking Cessation Audit Trail       Diabetic Foot Exam    No data filed     Diabetic Foot Form - Detailed    No data filed     Diabetic Foot Exam - Simple    No data filed     Guarantor Account: Kaileah, Shevchenko ([id number])     Relation to Patient: Account Type Service Area    Self Personal/Family [place] SERVICE AREA       Coverages for This Account     Coverage ID Payor Plan Insurance ID    [id number] [place] [employer] [id number]    [phone] [employer] [id number]        Guarantor Account: Alexandra, Lipps ([id number])     Relation to Patient: Account Type Service Area    Self Personal/Family [facility]       Coverages for This Account     Coverage ID Payor Plan Insurance ID    [id number] Gwynneth Macleod [id number]        Guarantor Account: Kaitlynne, Wenz ([id number])     Relation to Patient: Account Type Service Area    Self Personal/Family [facility]  [facility]       Coverages for This Account     Coverage ID Payor Plan Insurance ID    [id number] [place] MANAGED [id number]        Guarantor Account: Loreli, Debruler ([id number])     Relation to Patient: Account Type Service Area    Self Personal/Family [facility]          Guarantor Account: Melda, Mermelstein ([id number])     Relation to Patient: Account Type Service Area    Self Personal/Family [facility]

## 2014-05-24 ENCOUNTER — Encounter (HOSPITAL_COMMUNITY): Payer: Self-pay | Admitting: Surgery

## 2016-08-07 ENCOUNTER — Telehealth: Payer: Self-pay | Admitting: Nurse Practitioner

## 2016-08-07 NOTE — Telephone Encounter (Signed)
Received records from [facility] for appointment on 08/13/16 with Flavia Shipper Berge, NP.  Records put with Chris's schedule for 08/13/16.  lp

## 2016-08-13 ENCOUNTER — Ambulatory Visit (INDEPENDENT_AMBULATORY_CARE_PROVIDER_SITE_OTHER): Payer: Managed Care, Other (non HMO) | Admitting: Nurse Practitioner

## 2016-08-13 ENCOUNTER — Encounter: Payer: Self-pay | Admitting: Nurse Practitioner

## 2016-08-13 VITALS — BP 96/61 | HR 65 | Ht 63.0 in | Wt 124.4 lb

## 2016-08-13 DIAGNOSIS — R011 Cardiac murmur, unspecified: Secondary | ICD-10-CM

## 2016-08-13 NOTE — Patient Instructions (Signed)
Medication Instructions:  Continue current medications  Labwork: None Ordered  Testing/Procedures: Your physician has requested that you have an echocardiogram. Echocardiography is a painless test that uses sound waves to create images of your heart. It provides your doctor with information about the size and shape of your heart and how well your heart's chambers and valves are working. This procedure takes approximately one hour. There are no restrictions for this procedure.  Follow-Up: Your physician recommends that you schedule a follow-up appointment in: As Needed   Any Other Special Instructions Will Be Listed Below (If Applicable).   If you need a refill on your cardiac medications before your next appointment, please call your pharmacy.   

## 2016-08-13 NOTE — Progress Notes (Signed)
Cardiology Clinic Note   Patient Name: Sara Ross Date of Encounter: 08/13/2016  Primary Care Provider:  Murrell ConverseShelburne, David M Primary Cardiologist:  New - f/u T. Duke Salviaandolph, MD  Patient Profile    41 y/o ? with a h/o Rheumatic fever in her early teens he presents for cardiology evaluation secondary to recently identified cardiac murmur.  Past Medical History    Past Medical History:  Diagnosis Date  . Biliary colic    a. 05/2014 s/p cholecystectomy.  . Breast cyst   . Migraine headache    a. H/o migraine in past - tx with OTC.  Marland Kitchen. Rheumatic fever    a. Dx in early teens - no sequelae.   Past Surgical History:  Procedure Laterality Date  . CHOLECYSTECTOMY N/A 05/23/2014   Procedure: LAPAROSCOPIC CHOLECYSTECTOMY WITH INTRAOPERATIVE CHOLANGIOGRAM;  Surgeon: Harriette Bouillonhomas Cornett, MD;  Location: [facility] OR;  Service: General;  Laterality: N/A;  . NO PAST SURGERIES    . VAGINAL DELIVERY  2013   /w epidural   . WISDOM TOOTH EXTRACTION     /w local     Allergies  No Known Allergies  History of Present Illness    41 with a prior history of rheumatic fever in her early teens. She otherwise denies significant cardiac history. She does have a prior history migraine headache, breast cyst, and abdominal pain status post cholecystectomy 2015. [place] and 39-year-old son. She is very active, exercising 4-5 times per week with a personal trainer. She has not had any change in exercise tolerance and denied any prior history of chest pain, dyspnea, lightheadedness and presyncope, syncope, edema, or early satiety. She occasionally has a cigarette has never had a smoking habit per se. She takes 2 glasses of wine per week.  She was recently evaluated her OBGYN physician for routine follow up. She was noted to have a murmur on exam and as a result she was referred for cardiology evaluation today.  She was not aware of any prior h/o cardiac murmur and doesn't think that she has ever  had an echo.  Home Medications    Prior to Admission medications   Medication Sig Start Date End Date Taking? Authorizing Provider  acetaminophen (TYLENOL) 500 MG tablet Take 1,000 mg by mouth every 6 (six) hours as needed for moderate pain.   Yes Historical Provider, MD  ibuprofen (ADVIL,MOTRIN) 200 MG tablet Take 200 mg by mouth every 6 (six) hours as needed for headache or mild pain.   Yes Historical Provider, MD    Family History    Family History  Problem Relation Age of Onset  . Throat cancer Father     Died @ age 39. He was a smoker.  . Hypertension Mother     Alive @ age 39.  Marland Kitchen. Heart attack Maternal Grandfather     Died @ 6038.    Social History    Social History   Social History  . Marital status: Married    Spouse name: N/A  . Number of children: N/A  . Years of education: N/A   Occupational History  . Environmental health practitionerAdministrative Assistant    Social History Main Topics  . Smoking status: Current Some Day Smoker    Types: Cigarettes  . Smokeless tobacco: Never Used     Comment: smokes about one cigarette a month or every few months.  . Alcohol use 1.2 oz/week    2 Glasses of wine per week     Comment:  Glass of wine about twice/wk.  . Drug use: No  . Sexual activity: Yes    Birth control/ protection: None   Other Topics Concern  . Not on file   Social History Narrative   Lives in [place] with her husband and 63 year old son.  Exercises 4-5 days/wk with personal trainer.     Review of Systems    General:  No chills, fever, night sweats or weight changes.  Cardiovascular:  No chest pain, dyspnea on exertion, edema, orthopnea, palpitations, paroxysmal nocturnal dyspnea. Dermatological: No rash, lesions/masses Respiratory: No cough, dyspnea Urologic: No hematuria, dysuria Abdominal:   No nausea, vomiting, diarrhea, bright red blood per rectum, melena, or hematemesis Neurologic:  No visual changes, wkns, changes in mental status. All other systems reviewed and are  otherwise negative except as noted above.  Physical Exam    VS:  BP 96/61 (BP Location: Left Arm, Patient Position: Sitting, Cuff Size: Normal)   Pulse 65   Ht 5\' 3"  (1.6 m)   Wt 124 lb 6.4 oz (56.4 kg)   BMI 22.04 kg/m  , BMI Body mass index is 22.04 kg/m. GEN: Well nourished, well developed, in no acute distress.  HEENT: normal.  Neck: Supple, no JVD, carotid bruits, or masses. Cardiac: RRR, 1/6 syst murmur most notable @ the LLSB, no rubs, or gallops. No clubbing, cyanosis, edema.  Radials/DP/PT 2+ and equal bilaterally.  Respiratory:  Respirations regular and unlabored, clear to auscultation bilaterally. GI: Soft, nontender, nondistended, BS + x 4. MS: no deformity or atrophy. Skin: warm and dry, no rash. Neuro:  Strength and sensation are intact. Psych: Normal affect.  Accessory Clinical Findings    ECG - Sinus arrhythmia, 65, no acute ST or T changes.  Assessment & Plan   1.  Systolic murmur/history of rheumatic heart disease: 41 year old female with a history of rheumatic heart disease diagnosed in her early teens. She cannot recall exactly but does not remember any significant sequelae for diagnosis of murmur following that event. She is very active, exercising 4-5 times per week without limitations in activity. She has no prior history of chest pain, dyspnea, or syncope. She was recently noted to have a murmur referred for cardiology evaluation. She does have a soft systolic murmur at the left lower sternal border. I will arrange for a 2-D echocardiogram to evaluate.   2. Follow-up echocardiogram. Follow-up when necessary pending echo results.    Nicolasa Ducking, NP 08/13/2016, 12:23 PM

## 2016-08-27 ENCOUNTER — Other Ambulatory Visit (HOSPITAL_COMMUNITY): Payer: Managed Care, Other (non HMO)

## 2016-09-08 ENCOUNTER — Other Ambulatory Visit (HOSPITAL_COMMUNITY): Payer: Managed Care, Other (non HMO)

## 2020-05-05 ENCOUNTER — Other Ambulatory Visit: Payer: Self-pay

## 2020-05-05 ENCOUNTER — Emergency Department (HOSPITAL_BASED_OUTPATIENT_CLINIC_OR_DEPARTMENT_OTHER)
Admission: EM | Admit: 2020-05-05 | Discharge: 2020-05-05 | Disposition: A | Discharge status: Home / Self Care | Payer: 59 | Attending: Emergency Medicine | Admitting: Emergency Medicine

## 2020-05-05 ENCOUNTER — Emergency Department (HOSPITAL_BASED_OUTPATIENT_CLINIC_OR_DEPARTMENT_OTHER): Payer: 59

## 2020-05-05 ENCOUNTER — Encounter (HOSPITAL_BASED_OUTPATIENT_CLINIC_OR_DEPARTMENT_OTHER): Payer: Self-pay | Admitting: *Deleted

## 2020-05-05 DIAGNOSIS — S93402A Sprain of unspecified ligament of left ankle, initial encounter: Secondary | ICD-10-CM | POA: Insufficient documentation

## 2020-05-05 DIAGNOSIS — S99912A Unspecified injury of left ankle, initial encounter: Secondary | ICD-10-CM | POA: Diagnosis present

## 2020-05-05 DIAGNOSIS — Z87891 Personal history of nicotine dependence: Secondary | ICD-10-CM | POA: Insufficient documentation

## 2020-05-05 DIAGNOSIS — Y9239 Other specified sports and athletic area as the place of occurrence of the external cause: Secondary | ICD-10-CM | POA: Diagnosis not present

## 2020-05-05 DIAGNOSIS — X500XXA Overexertion from strenuous movement or load, initial encounter: Secondary | ICD-10-CM | POA: Insufficient documentation

## 2020-05-05 DIAGNOSIS — Y9301 Activity, walking, marching and hiking: Secondary | ICD-10-CM | POA: Diagnosis not present

## 2020-05-05 NOTE — ED Notes (Signed)
Pt refused Crutches. Says she has some at home if needed but thinks the ASO will be all she needs.

## 2020-05-05 NOTE — ED Triage Notes (Addendum)
States she rolled her left ankle yesterday at the gym. Ambulated to triage with limp. Swelling noted

## 2020-05-05 NOTE — ED Provider Notes (Addendum)
[facility] EMERGENCY DEPARTMENT Provider Note  CSN: [id number] Arrival date & time: 05/05/20 1614    History Chief Complaint  Patient presents with  . Ankle Injury    HPI  Sara Ross is a 44 y.o. female reports she rolled her left ankle yesterday morning while walking and has had pain and swelling to that area since yesterday. She has been able to bear weight on her heel, but not on her full foot. No other injuries.    Past Medical History:  Diagnosis Date  . Biliary colic    a. 05/2014 s/p cholecystectomy.  . Breast cyst   . Migraine headache    a. H/o migraine in past - tx with OTC.  Marland Kitchen Rheumatic fever    a. Dx in early teens - no sequelae.    Past Surgical History:  Procedure Laterality Date  . CHOLECYSTECTOMY N/A 05/23/2014   Procedure: LAPAROSCOPIC CHOLECYSTECTOMY WITH INTRAOPERATIVE CHOLANGIOGRAM;  Surgeon: Harriette Bouillon, MD;  Location: [facility] OR;  Service: General;  Laterality: N/A;  . NO PAST SURGERIES    . VAGINAL DELIVERY  2013   /w epidural   . WISDOM TOOTH EXTRACTION     /w local     Family History  Problem Relation Age of Onset  . Throat cancer Father        Died @ age 64. He was a smoker.  . Hypertension Mother        Alive @ age 42.  Marland Kitchen Heart attack Maternal Grandfather        Died @ 72.    Social History   Tobacco Use  . Smoking status: Former Smoker    Types: Cigarettes  . Smokeless tobacco: Never Used  . Tobacco comment: smokes about one cigarette a month or every few months.  Substance Use Topics  . Alcohol use: Yes    Alcohol/week: 2.0 standard drinks    Types: 2 Glasses of wine per week    Comment: Glass of wine about twice/wk.  . Drug use: No     Home Medications Prior to Admission medications   Medication Sig Start Date End Date Taking? Authorizing Provider  acetaminophen (TYLENOL) 500 MG tablet Take 1,000 mg by mouth every 6 (six) hours as needed for moderate pain.    [provider]  ibuprofen  (ADVIL,MOTRIN) 200 MG tablet Take 200 mg by mouth every 6 (six) hours as needed for headache or mild pain.    [provider]     Allergies    Patient has no known allergies.   Review of Systems   Review of Systems A comprehensive review of systems was completed and negative except as noted in HPI.    Physical Exam BP (!) 113/54 (BP Location: Left Arm)   Pulse 73   Temp 98.9 F (37.2 C) (Oral)   Resp 16   Ht 5\' 3"  (1.6 m)   Wt 58.1 kg   LMP 05/02/2020   SpO2 99%   BMI 22.67 kg/m   Physical Exam Vitals and nursing note reviewed.  HENT:     Head: Normocephalic.     Nose: Nose normal.  Eyes:     Extraocular Movements: Extraocular movements intact.  Pulmonary:     Effort: Pulmonary effort is normal.  Musculoskeletal:     Cervical back: Neck supple.     Comments: Bruising/swelling to L lateral foot and ankle. No bony tenderness over either malleolus or base of 5th metatarsal  Skin:    Findings:  No rash (on exposed skin).  Neurological:     Mental Status: She is alert and oriented to person, place, and time.  Psychiatric:        Mood and Affect: Mood normal.      ED Results / Procedures / Treatments   Labs (all labs ordered are listed, but only abnormal results are displayed) Labs Reviewed - No data to display  EKG None   Radiology DG Ankle Complete Left  Result Date: 05/05/2020 CLINICAL DATA:  Recent twisting injury with ankle pain, initial encounter EXAM: LEFT ANKLE COMPLETE - 3+ VIEW COMPARISON:  None. FINDINGS: Soft tissue swelling is noted about the ankle. No acute fracture or dislocation is seen. IMPRESSION: Soft tissue swelling without acute abnormality. Electronically Signed   By: Alcide Clever M.D.   On: 05/05/2020 16:46    Procedures Procedures  Medications Ordered in the ED Medications - No data to display   MDM Rules/Calculators/A&P MDM Xray neg for bony injury of ankle. Patient given ASO splint and crutches for comfort. Advised  to avoid bearing weight until pain improves. Ortho follow up if not improving.  ED Course  I have reviewed the triage vital signs and the nursing notes.  Pertinent labs & imaging results that were available during my care of the patient were reviewed by me and considered in my medical decision making (see chart for details).  Clinical Course as of May 06 2139  Sat May 05, 2020  1735 Patient has declined crutches, feels that ASO will be sufficient.    [CS]    Clinical Course User Index [CS] Pollyann Savoy, MD    Final Clinical Impression(s) / ED Diagnoses Final diagnoses:  Sprain of left ankle, unspecified ligament, initial encounter    Rx / DC Orders ED Discharge Orders    None       Pollyann Savoy, MD 05/05/20 1713    Pollyann Savoy, MD 05/05/20 2140

## 2020-05-29 ENCOUNTER — Telehealth: Payer: Self-pay | Admitting: Family Medicine

## 2020-05-29 NOTE — Telephone Encounter (Signed)
Per patient she her ankle is much better --Appt not needed. --glh

## 2021-04-15 IMAGING — DX DG ANKLE COMPLETE 3+V*L*
3 series · 3 of 3 positions shown · non-contrast
Comparison: None.

CLINICAL DATA: Recent twisting injury with ankle pain, initial
encounter

EXAM:
LEFT ANKLE COMPLETE - 3+ VIEW

[ankle ap]
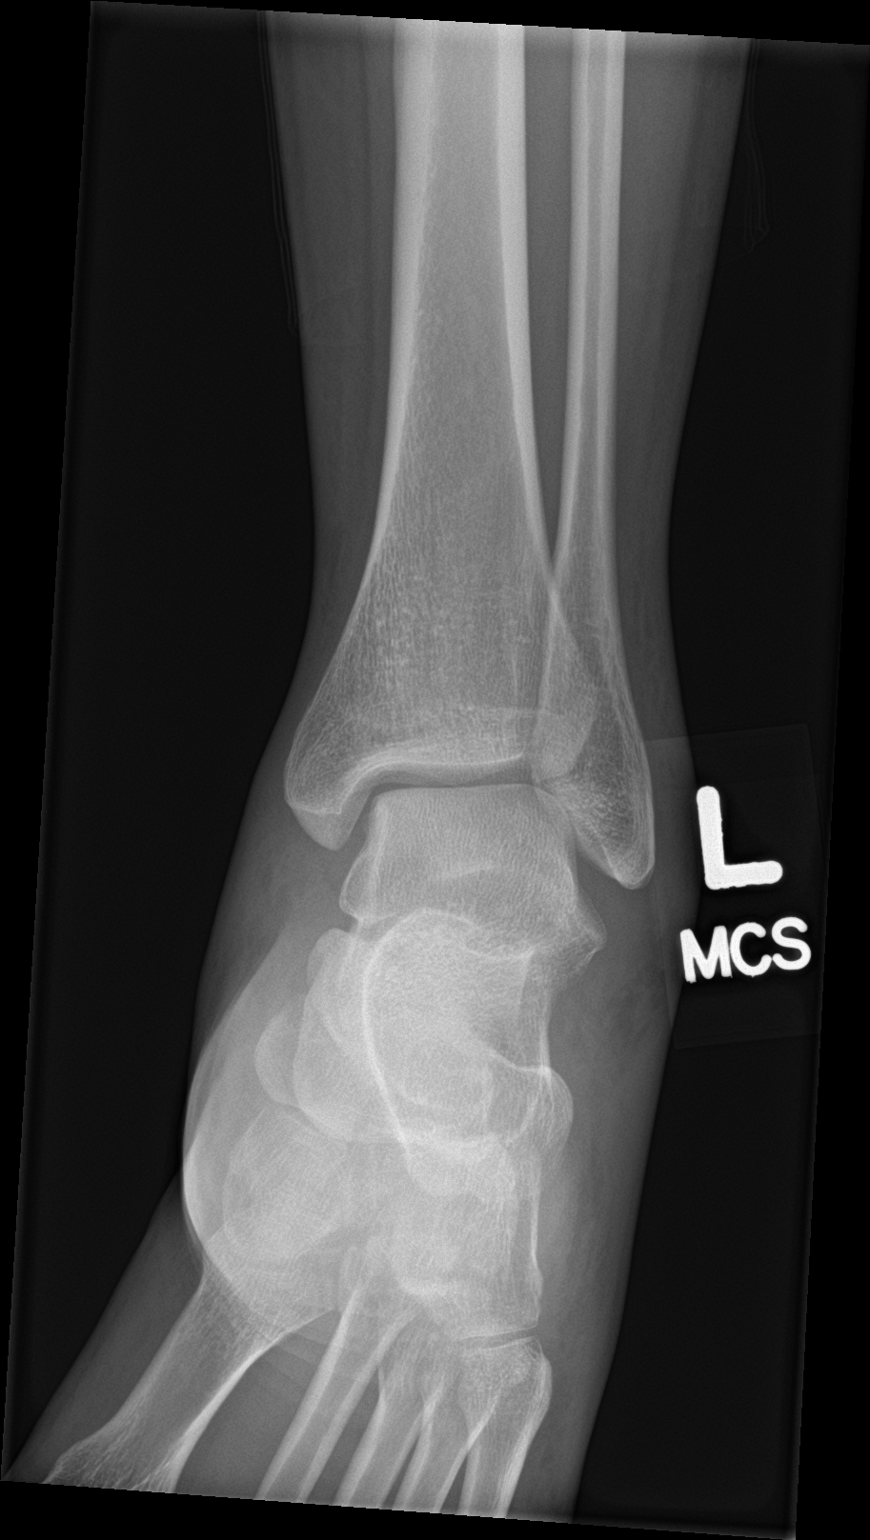

[ankle obl]
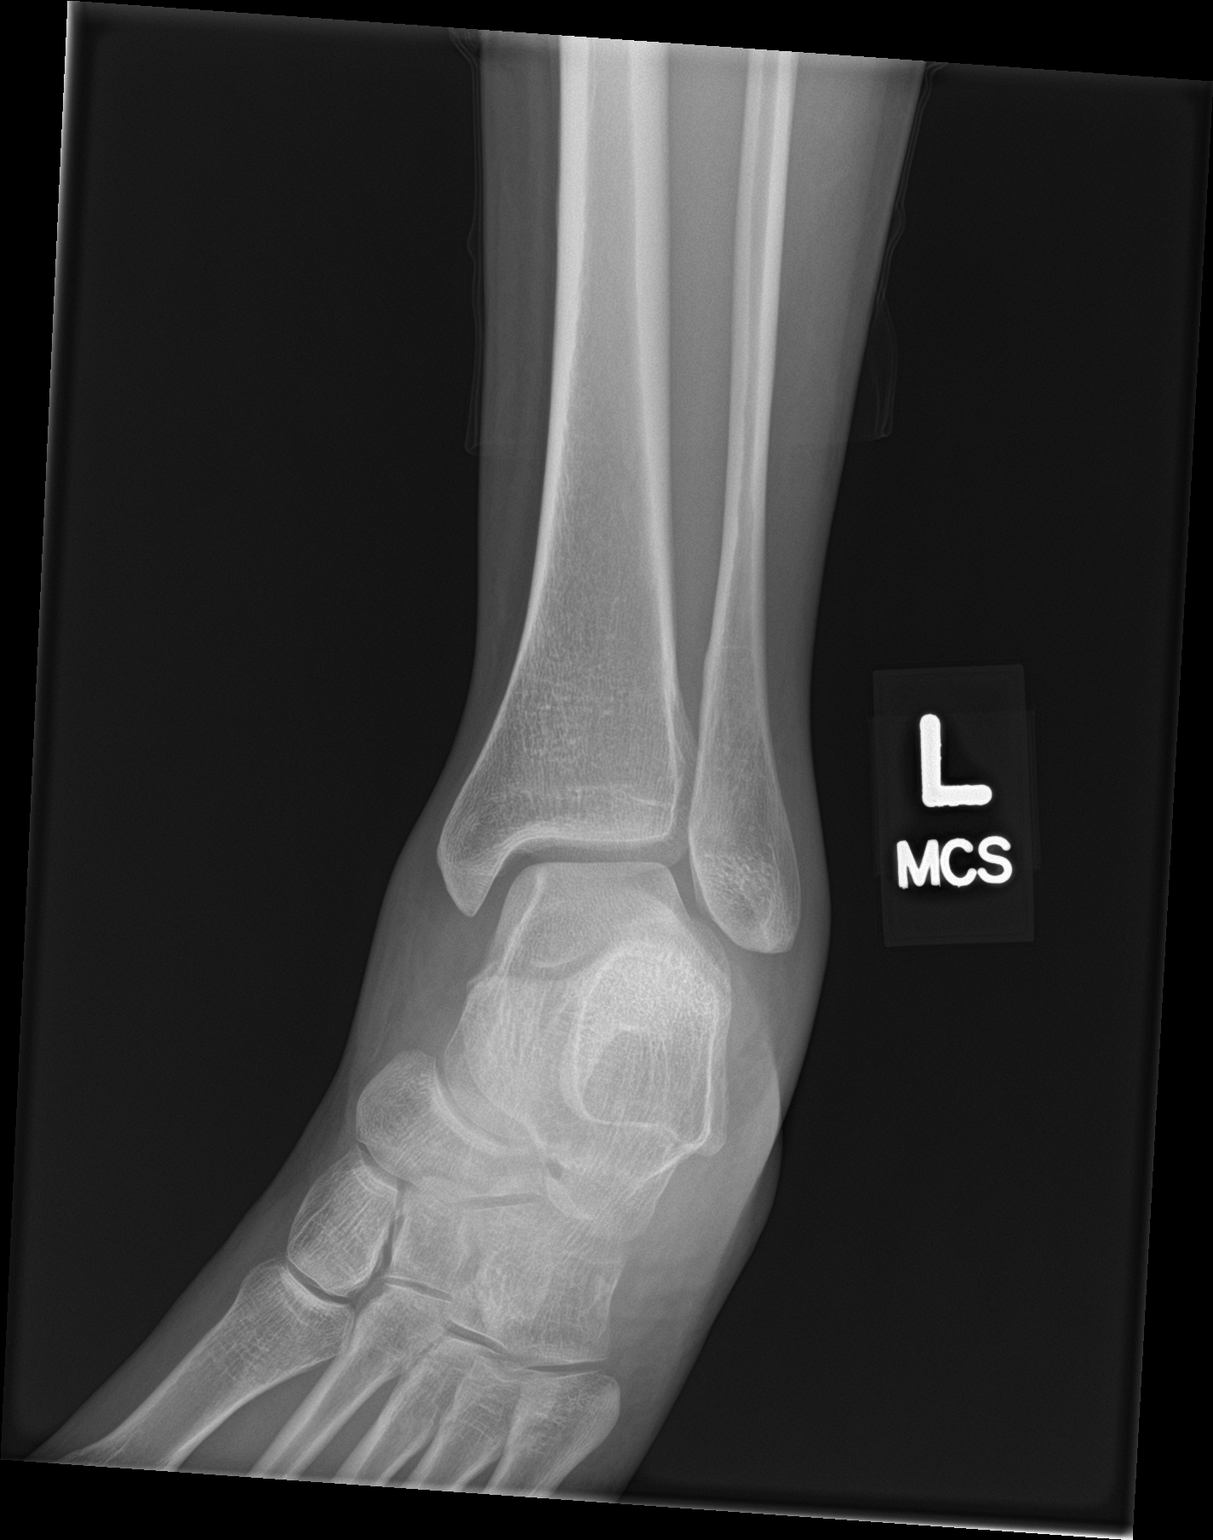

[ankle lat]
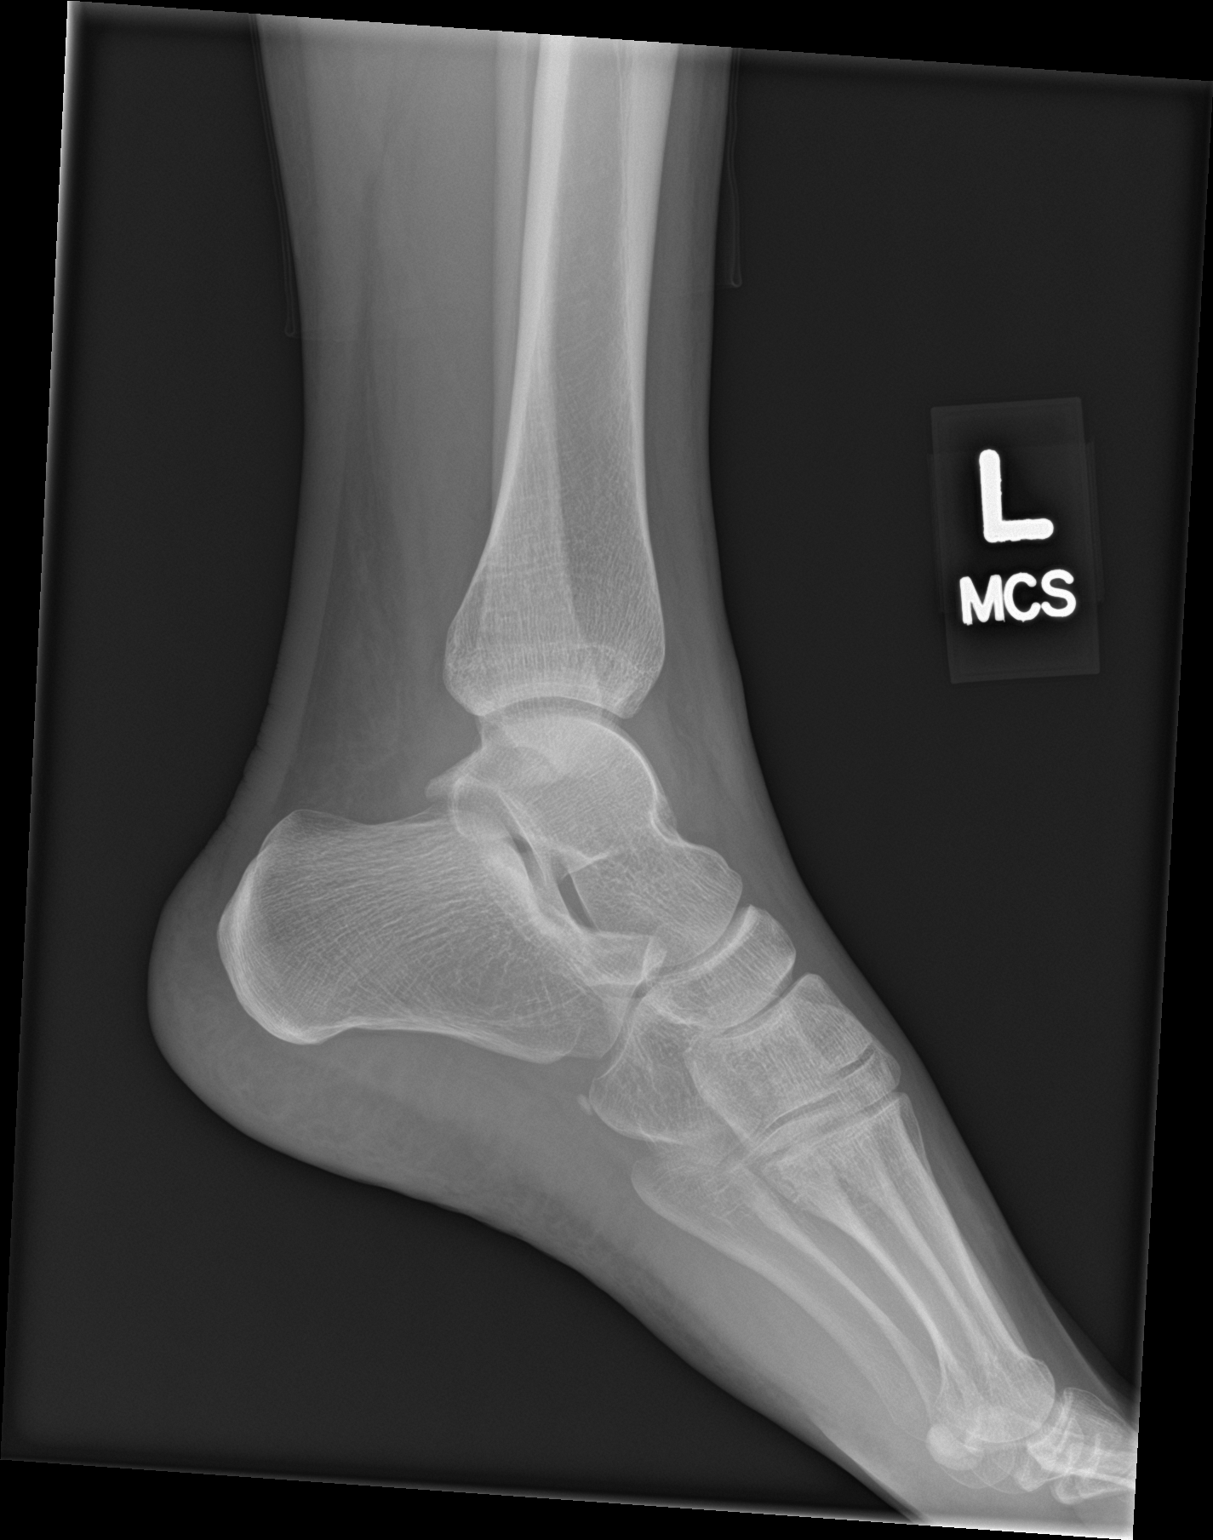

[3 of 3 positions shown; findings below may reference images not displayed]

FINDINGS: Soft tissue swelling is noted about the ankle. No acute fracture or
dislocation is seen.
IMPRESSION: Soft tissue swelling without acute abnormality.

## 2022-10-29 ENCOUNTER — Encounter: Payer: Self-pay | Admitting: *Deleted
